# Patient Record
Sex: Female | Born: 1975
Health system: Southern US, Community
[De-identification: ages and names within clinical notes are randomized; demographics above are authoritative.]

## PROBLEM LIST (undated history)

## (undated) DIAGNOSIS — F419 Anxiety disorder, unspecified: Secondary | ICD-10-CM

## (undated) DIAGNOSIS — F32A Depression, unspecified: Secondary | ICD-10-CM

## (undated) DIAGNOSIS — I10 Essential (primary) hypertension: Secondary | ICD-10-CM

## (undated) DIAGNOSIS — F329 Major depressive disorder, single episode, unspecified: Secondary | ICD-10-CM

---

## 2017-07-11 NOTE — L&D Delivery Note (Addendum)
Delivery Note At 7:26 PM a viable and healthy female was delivered via Vaginal, Spontaneous (Presentation: OA ).  APGAR: 9, 9; weight -pending  .   Placenta status:aspontaneous, intact Cord:  with the following complications: Loose nuchal cord, reduced over shoulder. .  Cord pH: N/A  Anesthesia:  Epidural  Episiotomy: None Lacerations: 2nd degree;Perineal Suture Repair: 3.0 vicryl rapide Est. Blood Loss (mL): 250  Mom to ICU for postpartum Magnesium for 24 hours.Pecola Leisure.  Baby to Couplet care / Skin to Skin.  Robley FriesVaishali R Prynce Jacober 01/22/2018, 7:53 PM

## 2017-09-14 DIAGNOSIS — O09522 Supervision of elderly multigravida, second trimester: Secondary | ICD-10-CM | POA: Diagnosis not present

## 2017-09-14 DIAGNOSIS — Z3A20 20 weeks gestation of pregnancy: Secondary | ICD-10-CM | POA: Diagnosis not present

## 2017-09-27 DIAGNOSIS — Z3A22 22 weeks gestation of pregnancy: Secondary | ICD-10-CM | POA: Diagnosis not present

## 2017-09-27 DIAGNOSIS — O09522 Supervision of elderly multigravida, second trimester: Secondary | ICD-10-CM | POA: Diagnosis not present

## 2017-09-27 DIAGNOSIS — Z362 Encounter for other antenatal screening follow-up: Secondary | ICD-10-CM | POA: Diagnosis not present

## 2017-11-09 DIAGNOSIS — O09523 Supervision of elderly multigravida, third trimester: Secondary | ICD-10-CM | POA: Diagnosis not present

## 2017-11-09 DIAGNOSIS — Z3A28 28 weeks gestation of pregnancy: Secondary | ICD-10-CM | POA: Diagnosis not present

## 2017-11-09 DIAGNOSIS — Z3689 Encounter for other specified antenatal screening: Secondary | ICD-10-CM | POA: Diagnosis not present

## 2017-12-03 DIAGNOSIS — Z3A31 31 weeks gestation of pregnancy: Secondary | ICD-10-CM | POA: Diagnosis not present

## 2017-12-03 DIAGNOSIS — O09523 Supervision of elderly multigravida, third trimester: Secondary | ICD-10-CM | POA: Diagnosis not present

## 2017-12-03 DIAGNOSIS — Z3483 Encounter for supervision of other normal pregnancy, third trimester: Secondary | ICD-10-CM | POA: Diagnosis not present

## 2017-12-05 DIAGNOSIS — Z3A31 31 weeks gestation of pregnancy: Secondary | ICD-10-CM | POA: Diagnosis not present

## 2017-12-05 DIAGNOSIS — O09523 Supervision of elderly multigravida, third trimester: Secondary | ICD-10-CM | POA: Diagnosis not present

## 2017-12-07 DIAGNOSIS — O09523 Supervision of elderly multigravida, third trimester: Secondary | ICD-10-CM | POA: Diagnosis not present

## 2017-12-07 DIAGNOSIS — Z3A32 32 weeks gestation of pregnancy: Secondary | ICD-10-CM | POA: Diagnosis not present

## 2017-12-19 DIAGNOSIS — O09523 Supervision of elderly multigravida, third trimester: Secondary | ICD-10-CM | POA: Diagnosis not present

## 2017-12-19 DIAGNOSIS — Z3A33 33 weeks gestation of pregnancy: Secondary | ICD-10-CM | POA: Diagnosis not present

## 2018-01-01 DIAGNOSIS — O09523 Supervision of elderly multigravida, third trimester: Secondary | ICD-10-CM | POA: Diagnosis not present

## 2018-01-01 DIAGNOSIS — Z3689 Encounter for other specified antenatal screening: Secondary | ICD-10-CM | POA: Diagnosis not present

## 2018-01-01 DIAGNOSIS — Z3A36 36 weeks gestation of pregnancy: Secondary | ICD-10-CM | POA: Diagnosis not present

## 2018-01-01 DIAGNOSIS — Z3A35 35 weeks gestation of pregnancy: Secondary | ICD-10-CM | POA: Diagnosis not present

## 2018-01-04 DIAGNOSIS — Z3A36 36 weeks gestation of pregnancy: Secondary | ICD-10-CM | POA: Diagnosis not present

## 2018-01-04 DIAGNOSIS — O09523 Supervision of elderly multigravida, third trimester: Secondary | ICD-10-CM | POA: Diagnosis not present

## 2018-01-05 DIAGNOSIS — Z3A36 36 weeks gestation of pregnancy: Secondary | ICD-10-CM | POA: Diagnosis not present

## 2018-01-05 DIAGNOSIS — O09523 Supervision of elderly multigravida, third trimester: Secondary | ICD-10-CM | POA: Diagnosis not present

## 2018-01-08 DIAGNOSIS — Z3A36 36 weeks gestation of pregnancy: Secondary | ICD-10-CM | POA: Diagnosis not present

## 2018-01-08 DIAGNOSIS — O403XX0 Polyhydramnios, third trimester, not applicable or unspecified: Secondary | ICD-10-CM | POA: Diagnosis not present

## 2018-01-08 DIAGNOSIS — Z0371 Encounter for suspected problem with amniotic cavity and membrane ruled out: Secondary | ICD-10-CM | POA: Diagnosis not present

## 2018-01-08 DIAGNOSIS — O09523 Supervision of elderly multigravida, third trimester: Secondary | ICD-10-CM | POA: Diagnosis not present

## 2018-01-16 ENCOUNTER — Ambulatory Visit (INDEPENDENT_AMBULATORY_CARE_PROVIDER_SITE_OTHER): Payer: Self-pay | Admitting: Pediatrics

## 2018-01-16 DIAGNOSIS — Z7681 Expectant parent(s) prebirth pediatrician visit: Secondary | ICD-10-CM

## 2018-01-17 DIAGNOSIS — R030 Elevated blood-pressure reading, without diagnosis of hypertension: Secondary | ICD-10-CM | POA: Diagnosis not present

## 2018-01-17 DIAGNOSIS — O09513 Supervision of elderly primigravida, third trimester: Secondary | ICD-10-CM | POA: Diagnosis not present

## 2018-01-17 DIAGNOSIS — Z3483 Encounter for supervision of other normal pregnancy, third trimester: Secondary | ICD-10-CM | POA: Diagnosis not present

## 2018-01-17 DIAGNOSIS — Z3A38 38 weeks gestation of pregnancy: Secondary | ICD-10-CM | POA: Diagnosis not present

## 2018-01-19 ENCOUNTER — Other Ambulatory Visit: Payer: Self-pay | Admitting: Obstetrics and Gynecology

## 2018-01-19 DIAGNOSIS — O1404 Mild to moderate pre-eclampsia, complicating childbirth: Secondary | ICD-10-CM | POA: Diagnosis not present

## 2018-01-19 DIAGNOSIS — Z23 Encounter for immunization: Secondary | ICD-10-CM | POA: Diagnosis not present

## 2018-01-19 DIAGNOSIS — Z3A38 38 weeks gestation of pregnancy: Secondary | ICD-10-CM | POA: Diagnosis not present

## 2018-01-19 DIAGNOSIS — O1002 Pre-existing essential hypertension complicating childbirth: Secondary | ICD-10-CM | POA: Diagnosis not present

## 2018-01-19 DIAGNOSIS — O09513 Supervision of elderly primigravida, third trimester: Secondary | ICD-10-CM | POA: Diagnosis not present

## 2018-01-19 DIAGNOSIS — R030 Elevated blood-pressure reading, without diagnosis of hypertension: Secondary | ICD-10-CM | POA: Diagnosis not present

## 2018-01-20 LAB — PROTEIN / CREATININE RATIO, URINE
Creatinine, Urine: 22 mg/dL
Protein Creatinine Ratio: 0.36 mg/mg{Cre} — ABNORMAL HIGH (ref 0.00–0.15)
Total Protein, Urine: 8 mg/dL

## 2018-01-21 ENCOUNTER — Inpatient Hospital Stay (HOSPITAL_COMMUNITY)
Admission: AD | Admit: 2018-01-21 | Discharge: 2018-01-24 | DRG: 807 | Disposition: A | Discharge status: Home / Self Care | Payer: BLUE CROSS/BLUE SHIELD | Source: Ambulatory Visit | Attending: Obstetrics & Gynecology | Admitting: Obstetrics & Gynecology

## 2018-01-21 ENCOUNTER — Encounter (HOSPITAL_COMMUNITY): Payer: Self-pay

## 2018-01-21 DIAGNOSIS — O1493 Unspecified pre-eclampsia, third trimester: Secondary | ICD-10-CM | POA: Diagnosis present

## 2018-01-21 DIAGNOSIS — O1404 Mild to moderate pre-eclampsia, complicating childbirth: Secondary | ICD-10-CM | POA: Diagnosis present

## 2018-01-21 DIAGNOSIS — Z3A38 38 weeks gestation of pregnancy: Secondary | ICD-10-CM

## 2018-01-21 DIAGNOSIS — O1494 Unspecified pre-eclampsia, complicating childbirth: Secondary | ICD-10-CM | POA: Diagnosis present

## 2018-01-21 HISTORY — DX: Major depressive disorder, single episode, unspecified: F32.9

## 2018-01-21 HISTORY — DX: Depression, unspecified: F32.A

## 2018-01-21 HISTORY — DX: Anxiety disorder, unspecified: F41.9

## 2018-01-21 HISTORY — DX: Essential (primary) hypertension: I10

## 2018-01-21 LAB — COMPREHENSIVE METABOLIC PANEL
ALT: 36 U/L (ref 0–44)
AST: 42 U/L — ABNORMAL HIGH (ref 15–41)
Albumin: 3.2 g/dL — ABNORMAL LOW (ref 3.5–5.0)
Alkaline Phosphatase: 112 U/L (ref 38–126)
Anion gap: 12 (ref 5–15)
BUN: 11 mg/dL (ref 6–20)
CO2: 19 mmol/L — ABNORMAL LOW (ref 22–32)
Calcium: 9.3 mg/dL (ref 8.9–10.3)
Chloride: 104 mmol/L (ref 98–111)
Creatinine, Ser: 0.48 mg/dL (ref 0.44–1.00)
GFR calc Af Amer: >60 mL/min (ref >60)
GFR calc non Af Amer: >60 mL/min (ref >60)
Glucose, Bld: 90 mg/dL (ref 70–99)
Potassium: 3.9 mmol/L (ref 3.5–5.1)
Sodium: 135 mmol/L (ref 135–145)
Total Bilirubin: 0.4 mg/dL (ref 0.3–1.2)
Total Protein: 6.6 g/dL (ref 6.5–8.1)

## 2018-01-21 LAB — CBC
HCT: 34.6 % — ABNORMAL LOW (ref 36.0–46.0)
Hemoglobin: 11.9 g/dL — ABNORMAL LOW (ref 12.0–15.0)
MCH: 31.2 pg (ref 26.0–34.0)
MCHC: 34.4 g/dL (ref 30.0–36.0)
MCV: 90.8 fL (ref 78.0–100.0)
Platelets: 235 10*3/uL (ref 150–400)
RBC: 3.81 MIL/uL — ABNORMAL LOW (ref 3.87–5.11)
RDW: 13.3 % (ref 11.5–15.5)
WBC: 16.3 10*3/uL — ABNORMAL HIGH (ref 4.0–10.5)

## 2018-01-21 LAB — LACTATE DEHYDROGENASE: LDH: 198 U/L — ABNORMAL HIGH (ref 98–192)

## 2018-01-21 LAB — PROTEIN, URINE, 24 HOUR
Collection Interval-UPROT: 24 hours
Protein, 24H Urine: 429 mg/d — ABNORMAL HIGH (ref 50–100)
Protein, Urine: 6 mg/dL
Urine Total Volume-UPROT: 7150 mL

## 2018-01-21 MED ORDER — OXYCODONE-ACETAMINOPHEN 5-325 MG PO TABS: 1.0000 | ORAL_TABLET | ORAL | Status: DC | PRN

## 2018-01-21 MED ORDER — TERBUTALINE SULFATE 1 MG/ML IJ SOLN
0.2500 mg | Freq: Once | INTRAMUSCULAR | Status: DC | PRN
  Filled 2018-01-21: qty 1

## 2018-01-21 MED ORDER — OXYTOCIN BOLUS FROM INFUSION
500.0000 mL | Freq: Once | INTRAVENOUS | Status: AC
  Administered 2018-01-22: 500 mL via INTRAVENOUS

## 2018-01-21 MED ORDER — HYDROXYZINE HCL 50 MG PO TABS
50.0000 mg | ORAL_TABLET | Freq: Four times a day (QID) | ORAL | Status: DC | PRN
  Administered 2018-01-21: 50 mg via ORAL
  Filled 2018-01-21 (×3): qty 1

## 2018-01-21 MED ORDER — LACTATED RINGERS IV SOLN: 500.0000 mL | INTRAVENOUS | Status: DC | PRN

## 2018-01-21 MED ORDER — OXYCODONE-ACETAMINOPHEN 5-325 MG PO TABS: 2.0000 | ORAL_TABLET | ORAL | Status: DC | PRN

## 2018-01-21 MED ORDER — FLEET ENEMA 7-19 GM/118ML RE ENEM: 1.0000 | ENEMA | RECTAL | Status: DC | PRN

## 2018-01-21 MED ORDER — MISOPROSTOL 25 MCG QUARTER TABLET
25.0000 ug | ORAL_TABLET | ORAL | Status: DC | PRN
  Administered 2018-01-21 – 2018-01-22 (×3): 25 ug via VAGINAL
  Filled 2018-01-21 (×5): qty 1

## 2018-01-21 MED ORDER — LIDOCAINE HCL (PF) 1 % IJ SOLN
30.0000 mL | INTRAMUSCULAR | Status: DC | PRN
  Filled 2018-01-21: qty 30

## 2018-01-21 MED ORDER — ACETAMINOPHEN 325 MG PO TABS: 650.0000 mg | ORAL_TABLET | ORAL | Status: DC | PRN

## 2018-01-21 MED ORDER — SOD CITRATE-CITRIC ACID 500-334 MG/5ML PO SOLN: 30.0000 mL | ORAL | Status: DC | PRN

## 2018-01-21 MED ORDER — OXYTOCIN 40 UNITS IN LACTATED RINGERS INFUSION - SIMPLE MED
2.5000 [IU]/h | INTRAVENOUS | Status: DC
  Filled 2018-01-21: qty 1000

## 2018-01-21 MED ORDER — FENTANYL CITRATE (PF) 100 MCG/2ML IJ SOLN
50.0000 ug | INTRAMUSCULAR | Status: DC | PRN
  Administered 2018-01-22: 100 ug via INTRAVENOUS
  Filled 2018-01-21: qty 2

## 2018-01-21 MED ORDER — ONDANSETRON HCL 4 MG/2ML IJ SOLN: 4.0000 mg | Freq: Four times a day (QID) | INTRAMUSCULAR | Status: DC | PRN

## 2018-01-21 MED ORDER — LACTATED RINGERS IV SOLN
INTRAVENOUS | Status: DC
  Administered 2018-01-21 – 2018-01-22 (×2): via INTRAVENOUS
  Administered 2018-01-22: 125 mL/h via INTRAVENOUS

## 2018-01-21 NOTE — Progress Notes (Signed)
Prenatal counseling for impending newborn done-- 1st child currently 38wks, previous US did show some oligohydramnios, early prenatal care Z76.81

## 2018-01-21 NOTE — H&P (Signed)
Anna ShoalsJennifer Harrell is a 42 y.o. female presenting for IOL for PEC w/o severe features. OB History   None    No past medical history on file.  Family History: family history is not on file. Social History:  has no tobacco, alcohol, and drug history on file.     Maternal Diabetes: No Genetic Screening: Normal Maternal Ultrasounds/Referrals: Normal Fetal Ultrasounds or other Referrals:  None Maternal Substance Abuse:  No Significant Maternal Medications:  None Significant Maternal Lab Results:  None Other Comments:  None  Review of Systems  Constitutional: Negative.   All other systems reviewed and are negative.  Maternal Medical History:  Contractions: Frequency: rare.   Perceived severity is mild.    Fetal activity: Perceived fetal activity is normal.   Last perceived fetal movement was within the past hour.    Prenatal complications: Pre-eclampsia.   Prenatal Complications - Diabetes: none.      There were no vitals taken for this visit. Maternal Exam:  Uterine Assessment: Contraction strength is mild.  Contraction frequency is rare.   Abdomen: Patient reports no abdominal tenderness. Fetal presentation: vertex  Introitus: Normal vulva. Normal vagina.  Ferning test: not done.  Nitrazine test: not done.  Pelvis: adequate for delivery.   Cervix: Cervix evaluated by digital exam.     Physical Exam  Nursing note and vitals reviewed. Constitutional: She is oriented to person, place, and time. She appears well-developed and well-nourished.  HENT:  Head: Normocephalic and atraumatic.  Neck: Normal range of motion. Neck supple.  Cardiovascular: Normal rate and regular rhythm.  Respiratory: Breath sounds normal.  GI: Soft. Bowel sounds are normal.  Genitourinary: Vagina normal and uterus normal.  Musculoskeletal: Normal range of motion.  Neurological: She is alert and oriented to person, place, and time. She has normal reflexes.  Skin: Skin is warm and dry.   Psychiatric: She has a normal mood and affect.    Prenatal labs: ABO, Rh:   Antibody:   Rubella:   RPR:    HBsAg:    HIV:    GBS:     Assessment/Plan: PEC w/o severe features- newly diagnosed 38 wk IUP  Admit for cervical ripening Ck labs  Dima Mini J 01/21/2018, 10:33 PM

## 2018-01-22 ENCOUNTER — Inpatient Hospital Stay (HOSPITAL_COMMUNITY): Payer: BLUE CROSS/BLUE SHIELD | Admitting: Anesthesiology

## 2018-01-22 ENCOUNTER — Encounter (HOSPITAL_COMMUNITY): Payer: Self-pay

## 2018-01-22 DIAGNOSIS — O1494 Unspecified pre-eclampsia, complicating childbirth: Secondary | ICD-10-CM | POA: Diagnosis present

## 2018-01-22 LAB — CBC WITH DIFFERENTIAL/PLATELET
Basophils Absolute: 0 10*3/uL (ref 0.0–0.1)
Basophils Absolute: 0 10*3/uL (ref 0.0–0.1)
Basophils Relative: 0 %
Basophils Relative: 0 %
Eosinophils Absolute: 0.1 10*3/uL (ref 0.0–0.7)
Eosinophils Absolute: 0.1 10*3/uL (ref 0.0–0.7)
Eosinophils Relative: 1 %
Eosinophils Relative: 0 %
HCT: 37.2 % (ref 36.0–46.0)
HCT: 38.2 % (ref 36.0–46.0)
Hemoglobin: 12.8 g/dL (ref 12.0–15.0)
Hemoglobin: 13 g/dL (ref 12.0–15.0)
Lymphocytes Relative: 11 %
Lymphocytes Relative: 12 %
Lymphs Abs: 1.9 10*3/uL (ref 0.7–4.0)
Lymphs Abs: 2.4 10*3/uL (ref 0.7–4.0)
MCH: 31.6 pg (ref 26.0–34.0)
MCH: 31.5 pg (ref 26.0–34.0)
MCHC: 34.4 g/dL (ref 30.0–36.0)
MCHC: 34 g/dL (ref 30.0–36.0)
MCV: 91.9 fL (ref 78.0–100.0)
MCV: 92.5 fL (ref 78.0–100.0)
Monocytes Absolute: 0.7 10*3/uL (ref 0.1–1.0)
Monocytes Absolute: 0.9 10*3/uL (ref 0.1–1.0)
Monocytes Relative: 4 %
Monocytes Relative: 4 %
Neutro Abs: 14.4 10*3/uL — ABNORMAL HIGH (ref 1.7–7.7)
Neutro Abs: 16.6 10*3/uL — ABNORMAL HIGH (ref 1.7–7.7)
Neutrophils Relative %: 84 %
Neutrophils Relative %: 84 %
Platelets: 223 10*3/uL (ref 150–400)
Platelets: 242 10*3/uL (ref 150–400)
RBC: 4.05 MIL/uL (ref 3.87–5.11)
RBC: 4.13 MIL/uL (ref 3.87–5.11)
RDW: 13.4 % (ref 11.5–15.5)
RDW: 13.5 % (ref 11.5–15.5)
WBC: 17.1 10*3/uL — ABNORMAL HIGH (ref 4.0–10.5)
WBC: 19.9 10*3/uL — ABNORMAL HIGH (ref 4.0–10.5)

## 2018-01-22 LAB — COMPREHENSIVE METABOLIC PANEL
ALT: 37 U/L (ref 0–44)
AST: 42 U/L — ABNORMAL HIGH (ref 15–41)
Albumin: 3.4 g/dL — ABNORMAL LOW (ref 3.5–5.0)
Alkaline Phosphatase: 110 U/L (ref 38–126)
Anion gap: 9 (ref 5–15)
BUN: 7 mg/dL (ref 6–20)
CO2: 23 mmol/L (ref 22–32)
Calcium: 9.1 mg/dL (ref 8.9–10.3)
Chloride: 102 mmol/L (ref 98–111)
Creatinine, Ser: 0.44 mg/dL (ref 0.44–1.00)
GFR calc Af Amer: >60 mL/min (ref >60)
GFR calc non Af Amer: >60 mL/min (ref >60)
Glucose, Bld: 93 mg/dL (ref 70–99)
Potassium: 4 mmol/L (ref 3.5–5.1)
Sodium: 134 mmol/L — ABNORMAL LOW (ref 135–145)
Total Bilirubin: 0.5 mg/dL (ref 0.3–1.2)
Total Protein: 7.2 g/dL (ref 6.5–8.1)

## 2018-01-22 LAB — OB RESULTS CONSOLE HIV ANTIBODY (ROUTINE TESTING): HIV: NONREACTIVE

## 2018-01-22 LAB — TYPE AND SCREEN
ABO/RH(D): O POS
Antibody Screen: NEGATIVE

## 2018-01-22 LAB — ABO/RH: ABO/RH(D): O POS

## 2018-01-22 LAB — OB RESULTS CONSOLE GC/CHLAMYDIA
Chlamydia: NEGATIVE
Gonorrhea: NEGATIVE

## 2018-01-22 LAB — OB RESULTS CONSOLE RPR: RPR: NONREACTIVE

## 2018-01-22 LAB — OB RESULTS CONSOLE HEPATITIS B SURFACE ANTIGEN: Hepatitis B Surface Ag: NEGATIVE

## 2018-01-22 LAB — RPR: RPR Ser Ql: NONREACTIVE

## 2018-01-22 LAB — OB RESULTS CONSOLE RUBELLA ANTIBODY, IGM: Rubella: NON-IMMUNE/NOT IMMUNE

## 2018-01-22 MED ORDER — DIBUCAINE 1 % RE OINT
1.0000 "application " | TOPICAL_OINTMENT | RECTAL | Status: DC | PRN
  Administered 2018-01-22: 1 via RECTAL
  Filled 2018-01-22: qty 28

## 2018-01-22 MED ORDER — LACTATED RINGERS IV SOLN
500.0000 mL | Freq: Once | INTRAVENOUS | Status: AC
  Administered 2018-01-22: 500 mL via INTRAVENOUS

## 2018-01-22 MED ORDER — OXYTOCIN 40 UNITS IN LACTATED RINGERS INFUSION - SIMPLE MED
1.0000 m[IU]/min | INTRAVENOUS | Status: DC
  Administered 2018-01-22: 2 m[IU]/min via INTRAVENOUS
  Administered 2018-01-22: 6 m[IU]/min via INTRAVENOUS
  Administered 2018-01-22: 8 m[IU]/min via INTRAVENOUS

## 2018-01-22 MED ORDER — PRENATAL MULTIVITAMIN CH
1.0000 | ORAL_TABLET | Freq: Every day | ORAL | Status: DC
  Administered 2018-01-23 – 2018-01-24 (×2): 1 via ORAL
  Filled 2018-01-22 (×2): qty 1

## 2018-01-22 MED ORDER — OXYCODONE HCL 5 MG PO TABS: 5.0000 mg | ORAL_TABLET | ORAL | Status: DC | PRN

## 2018-01-22 MED ORDER — ONDANSETRON HCL 4 MG PO TABS: 4.0000 mg | ORAL_TABLET | ORAL | Status: DC | PRN

## 2018-01-22 MED ORDER — SENNOSIDES-DOCUSATE SODIUM 8.6-50 MG PO TABS
2.0000 | ORAL_TABLET | ORAL | Status: DC
  Administered 2018-01-22 – 2018-01-23 (×2): 2 via ORAL
  Filled 2018-01-22 (×2): qty 2

## 2018-01-22 MED ORDER — OXYCODONE HCL 5 MG PO TABS: 10.0000 mg | ORAL_TABLET | ORAL | Status: DC | PRN

## 2018-01-22 MED ORDER — PHENYLEPHRINE 40 MCG/ML (10ML) SYRINGE FOR IV PUSH (FOR BLOOD PRESSURE SUPPORT)
80.0000 ug | PREFILLED_SYRINGE | INTRAVENOUS | Status: DC | PRN
  Filled 2018-01-22: qty 5

## 2018-01-22 MED ORDER — COCONUT OIL OIL: 1.0000 "application " | TOPICAL_OIL | Status: DC | PRN

## 2018-01-22 MED ORDER — WITCH HAZEL-GLYCERIN EX PADS
1.0000 "application " | MEDICATED_PAD | CUTANEOUS | Status: DC | PRN
  Administered 2018-01-22: 1 via TOPICAL

## 2018-01-22 MED ORDER — IBUPROFEN 600 MG PO TABS
600.0000 mg | ORAL_TABLET | Freq: Four times a day (QID) | ORAL | Status: DC
  Administered 2018-01-22 – 2018-01-24 (×7): 600 mg via ORAL
  Filled 2018-01-22 (×7): qty 1

## 2018-01-22 MED ORDER — TETANUS-DIPHTH-ACELL PERTUSSIS 5-2.5-18.5 LF-MCG/0.5 IM SUSP: 0.5000 mL | Freq: Once | INTRAMUSCULAR | Status: DC

## 2018-01-22 MED ORDER — ZOLPIDEM TARTRATE 5 MG PO TABS: 5.0000 mg | ORAL_TABLET | Freq: Every evening | ORAL | Status: DC | PRN

## 2018-01-22 MED ORDER — FENTANYL 2.5 MCG/ML BUPIVACAINE 1/10 % EPIDURAL INFUSION (WH - ANES)
14.0000 mL/h | INTRAMUSCULAR | Status: DC | PRN
  Administered 2018-01-22: 14 mL/h via EPIDURAL
  Filled 2018-01-22: qty 100

## 2018-01-22 MED ORDER — DIPHENHYDRAMINE HCL 50 MG/ML IJ SOLN: 12.5000 mg | INTRAMUSCULAR | Status: DC | PRN

## 2018-01-22 MED ORDER — EPHEDRINE 5 MG/ML INJ
10.0000 mg | INTRAVENOUS | Status: DC | PRN
  Filled 2018-01-22: qty 2

## 2018-01-22 MED ORDER — DIPHENHYDRAMINE HCL 25 MG PO CAPS: 25.0000 mg | ORAL_CAPSULE | Freq: Four times a day (QID) | ORAL | Status: DC | PRN

## 2018-01-22 MED ORDER — BUTORPHANOL TARTRATE 1 MG/ML IJ SOLN: 1.0000 mg | Freq: Once | INTRAMUSCULAR | Status: DC

## 2018-01-22 MED ORDER — PHENYLEPHRINE 40 MCG/ML (10ML) SYRINGE FOR IV PUSH (FOR BLOOD PRESSURE SUPPORT)
80.0000 ug | PREFILLED_SYRINGE | INTRAVENOUS | Status: DC | PRN
  Filled 2018-01-22: qty 10
  Filled 2018-01-22: qty 5

## 2018-01-22 MED ORDER — TERBUTALINE SULFATE 1 MG/ML IJ SOLN: 0.2500 mg | Freq: Once | INTRAMUSCULAR | Status: DC | PRN

## 2018-01-22 MED ORDER — SIMETHICONE 80 MG PO CHEW: 80.0000 mg | CHEWABLE_TABLET | ORAL | Status: DC | PRN

## 2018-01-22 MED ORDER — LIDOCAINE HCL (PF) 1 % IJ SOLN
INTRAMUSCULAR | Status: DC | PRN
  Administered 2018-01-22: 8 mL via EPIDURAL

## 2018-01-22 MED ORDER — MAGNESIUM SULFATE 40 G IN LACTATED RINGERS - SIMPLE
2.0000 g/h | INTRAVENOUS | Status: DC
  Filled 2018-01-22: qty 500

## 2018-01-22 MED ORDER — HYDROXYZINE HCL 50 MG PO TABS
50.0000 mg | ORAL_TABLET | Freq: Four times a day (QID) | ORAL | Status: DC | PRN
  Administered 2018-01-22 – 2018-01-24 (×3): 50 mg via ORAL
  Filled 2018-01-22 (×5): qty 1

## 2018-01-22 MED ORDER — BENZOCAINE-MENTHOL 20-0.5 % EX AERO
1.0000 "application " | INHALATION_SPRAY | CUTANEOUS | Status: DC | PRN
  Administered 2018-01-22: 1 via TOPICAL
  Filled 2018-01-22: qty 56

## 2018-01-22 MED ORDER — ONDANSETRON HCL 4 MG/2ML IJ SOLN: 4.0000 mg | INTRAMUSCULAR | Status: DC | PRN

## 2018-01-22 MED ORDER — ACETAMINOPHEN 325 MG PO TABS: 650.0000 mg | ORAL_TABLET | ORAL | Status: DC | PRN

## 2018-01-22 NOTE — Progress Notes (Signed)
Patient ID: Anna ShoalsJennifer Harrell, female   DOB: 04-16-1976, 42 y.o.   MRN: [id number] Per RN, cervix thick No headaches or visual changes. No epigastric pain. BP 130/81   Pulse 86   Temp 98.2 F (36.8 C) (Oral)   Resp 20   Ht 5\' 6"  (1.676 m)   Wt 75.7 kg (166 lb 12.8 oz)   BMI 26.92 kg/m   CBC    Component Value Date/Time   WBC 17.1 (H) 01/22/2018 0810   RBC 4.05 01/22/2018 0810   HGB 12.8 01/22/2018 0810   HCT 37.2 01/22/2018 0810   PLT 223 01/22/2018 0810   MCV 91.9 01/22/2018 0810   MCH 31.6 01/22/2018 0810   MCHC 34.4 01/22/2018 0810   RDW 13.4 01/22/2018 0810   LYMPHSABS 1.9 01/22/2018 0810   MONOABS 0.7 01/22/2018 0810   EOSABS 0.1 01/22/2018 0810   BASOSABS 0.0 01/22/2018 0810   CMP     Component Value Date/Time   NA 134 (L) 01/22/2018 0810   K 4.0 01/22/2018 0810   CL 102 01/22/2018 0810   CO2 23 01/22/2018 0810   GLUCOSE 93 01/22/2018 0810   BUN 7 01/22/2018 0810   CREATININE 0.44 01/22/2018 0810   CALCIUM 9.1 01/22/2018 0810   PROT 7.2 01/22/2018 0810   ALBUMIN 3.4 (L) 01/22/2018 0810   AST 42 (H) 01/22/2018 0810   ALT 37 01/22/2018 0810   ALKPHOS 110 01/22/2018 0810   BILITOT 0.5 01/22/2018 0810   GFRNONAA >60 01/22/2018 0810   GFRAA >60 01/22/2018 0810   BP and labs stable Will rpt Cytotec at this time.

## 2018-01-22 NOTE — Plan of Care (Signed)
Pt educated on labor/induction process.  All questions answered.  Pt resting comfortably in bed after cytotec x2.  Plan discussed with MD and patient and family.  Will continue to monitor and communicate with team as needed.

## 2018-01-22 NOTE — Progress Notes (Signed)
Anna Harrell is a 42 y.o. G2P0010 at 4864w5d by LMP admitted [facility]  Subjective: NO complaints. NO HAs or visual changes  Objective: BP 123/84   Pulse 78   Temp 98.5 F (36.9 C) (Oral)   Resp 16   Ht 5\' 6"  (1.676 m)   Wt 75.7 kg (166 lb 12.8 oz)   BMI 26.92 kg/m  No intake/output data recorded. Total I/O In: 157 [I.V.:157] Out: -   FHT:  FHR: 125 bpm, variability: moderate,  accelerations:  Present,  decelerations:  Absent UC:   irregular, every 3-7 minutes SVE:   Dilation: 2 Effacement (%): 80 Station: -1 Exam by:: Anna Mustassie Stewart RN   Labs: Lab Results  Component Value Date   WBC 16.3 (H) 01/21/2018   HGB 11.9 (L) 01/21/2018   HCT 34.6 (L) 01/21/2018   MCV 90.8 01/21/2018   PLT 235 01/21/2018    Assessment / Plan: PEC w/o severe features- labs and BP stable  Labor: Progressing normally Preeclampsia:  no signs or symptoms of toxicity, intake and ouput balanced and labs stable Fetal Wellbeing:  Category I Pain Control:  Labor support without medications I/D:  n/a Anticipated MOD:  guarded  Anna Harrell J 01/22/2018, 6:39 AM

## 2018-01-22 NOTE — Anesthesia Pain Management Evaluation Note (Signed)
  CRNA Pain Management Visit Note  Patient: Anna Harrell, 42 y.o., female  "Hello I am a member of the anesthesia team [facility]. We have an anesthesia team available at all times to provide care throughout the hospital, including epidural management and anesthesia for C-section. I don't know your plan for the delivery whether it a natural birth, water birth, IV sedation, nitrous supplementation, doula or epidural, but we want to meet your pain goals."   1.Was your pain managed to your expectations on prior hospitalizations?   No prior hospitalizations  2.What is your expectation for pain management during this hospitalization?     Nitrous Oxide first  Then epidural   3.How can we help you reach that goal? standby  Record the patient's initial score and the patient's pain goal.   Pain: 7  Pain Goal: 3 [facility] wants you to be able to say your pain was always managed very well.  Ulice BoldDavid Adedayo [facility] 01/22/2018

## 2018-01-22 NOTE — Anesthesia Preprocedure Evaluation (Signed)
Anesthesia Evaluation  Patient identified by MRN, date of birth, ID band Patient awake    Reviewed: Allergy & Precautions, H&P , NPO status , Patient's Chart, lab work & pertinent test results, reviewed documented beta blocker date and time   Airway Mallampati: II  TM Distance: >3 FB Neck ROM: full    Dental no notable dental hx.    Pulmonary neg pulmonary ROS, former smoker,    Pulmonary exam normal breath sounds clear to auscultation       Cardiovascular hypertension, negative cardio ROS Normal cardiovascular exam Rhythm:regular Rate:Normal     Neuro/Psych negative neurological ROS  negative psych ROS   GI/Hepatic negative GI ROS, Neg liver ROS,   Endo/Other  negative endocrine ROS  Renal/GU negative Renal ROS  negative genitourinary   Musculoskeletal   Abdominal   Peds  Hematology negative hematology ROS (+)   Anesthesia Other Findings   Reproductive/Obstetrics (+) Pregnancy                             Anesthesia Physical  Anesthesia Plan  ASA: II  Anesthesia Plan: Epidural   Post-op Pain Management:    Induction:   PONV Risk Score and Plan:   Airway Management Planned:   Additional Equipment:   Intra-op Plan:   Post-operative Plan:   Informed Consent: I have reviewed the patients History and Physical, chart, labs and discussed the procedure including the risks, benefits and alternatives for the proposed anesthesia with the patient or authorized representative who has indicated his/her understanding and acceptance.       Plan Discussed with:   Anesthesia Plan Comments:         Anesthesia Quick Evaluation  

## 2018-01-22 NOTE — Anesthesia Procedure Notes (Signed)
Epidural Patient location during procedure: OB Start time: 01/22/2018 5:00 PM End time: 01/22/2018 5:10 PM  Staffing Anesthesiologist: Bethena Midgetddono, Logyn Dedominicis, MD  Preanesthetic Checklist Completed: patient identified, site marked, surgical consent, pre-op evaluation, timeout performed, IV checked, risks and benefits discussed and monitors and equipment checked  Epidural Patient position: sitting Prep: site prepped and draped and DuraPrep Patient monitoring: continuous pulse ox and blood pressure Approach: midline Location: L4-L5 Injection technique: LOR air  Needle:  Needle type: Tuohy  Needle gauge: 17 G Needle length: 9 cm and 9 Needle insertion depth: 8 cm Catheter type: closed end flexible Catheter size: 19 Gauge Catheter at skin depth: 13 cm Test dose: negative  Assessment Events: blood not aspirated, injection not painful, no injection resistance, negative IV test and no paresthesia

## 2018-01-22 NOTE — Progress Notes (Signed)
Anna Harrell is a 42 y.o. G2P0010 at 8870w5d IOL for PEC  Subjective: UCs gettingg stronger   Objective: BP 139/79   Pulse 82   Temp 98.4 F (36.9 C) (Oral)   Resp 16   Ht 5\' 6"  (1.676 m)   Wt 166 lb 12.8 oz (75.7 kg)   BMI 26.92 kg/m  I/O last 3 completed shifts: In: 157 [I.V.:157] Out: -  No intake/output data recorded.  FHT:  FHR: 140 bpm, variability: moderate,  accelerations:  Present,  decelerations:  Absent UC:   regular, every 2-3 minutes SVE: 4/90%/-2/ Vx. AROM clear.   Labs: Lab Results  Component Value Date   WBC 19.9 (H) 01/22/2018   HGB 13.0 01/22/2018   HCT 38.2 01/22/2018   MCV 92.5 01/22/2018   PLT 242 01/22/2018    Assessment / Plan: Induction of labor due to preeclampsia,  progressing well on pitocin  Labor: Progressing normally Preeclampsia:  no signs or symptoms of toxicity Fetal Wellbeing:  Category I Pain Control:  Epidural needed now  I/D:  GBS(-) Anticipated MOD:  NSVD  Robley FriesVaishali R Chari Parmenter 01/22/2018, 6:16 PM

## 2018-01-23 LAB — COMPREHENSIVE METABOLIC PANEL
ALT: 37 U/L (ref 0–44)
AST: 50 U/L — ABNORMAL HIGH (ref 15–41)
Albumin: 2.7 g/dL — ABNORMAL LOW (ref 3.5–5.0)
Alkaline Phosphatase: 89 U/L (ref 38–126)
Anion gap: 9 (ref 5–15)
BUN: 9 mg/dL (ref 6–20)
CO2: 20 mmol/L — ABNORMAL LOW (ref 22–32)
Calcium: 9 mg/dL (ref 8.9–10.3)
Chloride: 103 mmol/L (ref 98–111)
Creatinine, Ser: 0.56 mg/dL (ref 0.44–1.00)
GFR calc Af Amer: >60 mL/min (ref >60)
GFR calc non Af Amer: >60 mL/min (ref >60)
Glucose, Bld: 86 mg/dL (ref 70–99)
Potassium: 3.8 mmol/L (ref 3.5–5.1)
Sodium: 132 mmol/L — ABNORMAL LOW (ref 135–145)
Total Bilirubin: 0.8 mg/dL (ref 0.3–1.2)
Total Protein: 5.9 g/dL — ABNORMAL LOW (ref 6.5–8.1)

## 2018-01-23 LAB — CBC
HCT: 33.1 % — ABNORMAL LOW (ref 36.0–46.0)
Hemoglobin: 11.4 g/dL — ABNORMAL LOW (ref 12.0–15.0)
MCH: 31.1 pg (ref 26.0–34.0)
MCHC: 34.4 g/dL (ref 30.0–36.0)
MCV: 90.4 fL (ref 78.0–100.0)
Platelets: 201 10*3/uL (ref 150–400)
RBC: 3.66 MIL/uL — ABNORMAL LOW (ref 3.87–5.11)
RDW: 13.5 % (ref 11.5–15.5)
WBC: 19.5 10*3/uL — ABNORMAL HIGH (ref 4.0–10.5)

## 2018-01-23 LAB — MAGNESIUM: Magnesium: 1.5 mg/dL — ABNORMAL LOW (ref 1.7–2.4)

## 2018-01-23 MED ORDER — HYDROCORTISONE 2.5 % RE CREA
TOPICAL_CREAM | Freq: Four times a day (QID) | RECTAL | Status: DC
  Administered 2018-01-23 – 2018-01-24 (×3): via RECTAL
  Filled 2018-01-23: qty 28.35

## 2018-01-23 MED ORDER — HYDROCORTISONE ACE-PRAMOXINE 1-1 % RE FOAM
1.0000 | Freq: Four times a day (QID) | RECTAL | Status: DC
  Administered 2018-01-23 – 2018-01-24 (×2): 1 via RECTAL
  Filled 2018-01-23 (×2): qty 10

## 2018-01-23 MED ORDER — MEASLES, MUMPS & RUBELLA VAC ~~LOC~~ INJ
0.5000 mL | INJECTION | Freq: Once | SUBCUTANEOUS | Status: AC
  Administered 2018-01-24: 0.5 mL via SUBCUTANEOUS
  Filled 2018-01-23: qty 0.5

## 2018-01-23 NOTE — Progress Notes (Signed)
PPD 1 SVD with 2nd degree repair / PEC  S:  Reports feeling ok - hemorrhoid pain intense (hx outside of pregnancy)              Denies PIH labs             Tolerating po/ No nausea or vomiting             Bleeding is light             Pain controlled with Motrin             Up ad lib / ambulatory / voiding QS  Newborn Breast / female  O:   VS: BP (!) 125/91 (BP Location: Left Arm)   Pulse 70   Temp 97.7 F (36.5 C) (Oral)   Resp 16   Ht '5\' 6"'$  (1.676 m)   Wt 75.7 kg (166 lb 12.8 oz)   SpO2 100%   Breastfeeding? Unknown   BMI 26.92 kg/m    BP: 125/91 - 122/85 - 129/91 - 126/91 / 141/97  LABS:             Recent Labs    01/22/18 1621 01/23/18 0636  WBC 19.9* 19.5*  HGB 13.0 11.4*  PLT 242 201   Results for Anna, Harrell (MRN [id number]) as of 01/23/2018 08:31  Ref. Range 01/23/2018 06:36  Potassium Latest Ref Range: 3.5 - 5.1 mmol/L 3.8  Chloride Latest Ref Range: 98 - 111 mmol/L 103  CO2 Latest Ref Range: 22 - 32 mmol/L 20 (L)  Glucose Latest Ref Range: 70 - 99 mg/dL 86  BUN Latest Ref Range: 6 - 20 mg/dL 9  Creatinine Latest Ref Range: 0.44 - 1.00 mg/dL 0.56  Magnesium Latest Ref Range: 1.7 - 2.4 mg/dL 1.5 (L)  Alkaline Phosphatase Latest Ref Range: 38 - 126 U/L 89  Albumin Latest Ref Range: 3.5 - 5.0 g/dL 2.7 (L)  AST Latest Ref Range: 15 - 41 U/L 50 (H)  ALT Latest Ref Range: 0 - 44 U/L 37               Blood type: O pos  Rubella: Nonimmune (07/15 0000)  - MMR ordered               I&O: not measured              Physical Exam:             Alert and oriented X3  Lungs: Clear and unlabored  Heart: regular rate and rhythm / no mumurs  Abdomen: soft, non-tender, non-distended              Fundus: firm, non-tender, Ueven  Perineum: ice pack in place / + clustered inflamed hemorrhoids  Lochia: moderate  Extremities: trace edema, no calf pain or tenderness    A: PPD # 1 SVD with 2nd degree repair             preeclampsia - stable labs and BP  chronic hemorrhoids with current inflammation  P: Routine post partum orders             Monitor labs and BP closely - will need BP recheck Friday and next week for close follow-up    Artelia Laroche CNM, MSN, [facility] 01/23/2018, 8:30 AM

## 2018-01-23 NOTE — Anesthesia Postprocedure Evaluation (Signed)
Anesthesia Post Note  Patient: Peggyann ShoalsJennifer Romack  Procedure(s) Performed: AN AD HOC LABOR EPIDURAL     Patient location during evaluation: Mother Baby Anesthesia Type: Epidural Level of consciousness: awake and alert and oriented Pain management: satisfactory to patient Vital Signs Assessment: post-procedure vital signs reviewed and stable Respiratory status: spontaneous breathing and nonlabored ventilation Cardiovascular status: stable Postop Assessment: no headache, no backache, no signs of nausea or vomiting, adequate PO intake, patient able to bend at knees and able to ambulate (patient up walking) Anesthetic complications: no    Last Vitals:  Vitals:   01/23/18 0257 01/23/18 0543  BP: 122/85 (!) 125/91  Pulse: 75 70  Resp: 16 16  Temp: 36.8 C 36.5 C  SpO2: 99% 100%    Last Pain:  Vitals:   01/23/18 0543  TempSrc: Oral  PainSc:    Pain Goal:                 Madison HickmanGREGORY,Alina Gilkey

## 2018-01-23 NOTE — Lactation Note (Signed)
This note was copied from a baby's chart. Lactation Consultation Note  Patient Name: Anna Peggyann ShoalsJennifer Ran ZOXWR'UToday's Date: 01/23/2018   [facility] Assistance:  Infant sleeping as I arrived.  Mother stated that baby wants to only latch on the nipple and "pushes" away from the breast until she gets the nipple.  Since baby is not cueing I suggested mother call me back for the next feeding and I will be happy to assist.  Mother will call when ready to feed.       Taelar Gronewold R Taryll Reichenberger 01/23/2018, 8:54 PM

## 2018-01-23 NOTE — Progress Notes (Signed)
[facility] and verified GBS was negative on 01/01/18. Pediatrician Notified.

## 2018-01-23 NOTE — Lactation Note (Signed)
This note was copied from a baby's chart. Lactation Consultation Note  Patient Name: Anna Harrell ZOXWR'UToday's Date: 01/23/2018 Reason for consult: Follow-up assessment;Difficult latch  Follow Up for Latch Assistance:  Infant awake and alert and ready to feed. Assisted mother to latch in the football hold on the left breast.  With repeated attempts baby was able to latch but was fussy at breast.  She kept wanting to pull of the breast.  Burpted and attempted again with the same results.   Tried the cross cradle position but baby still would not suck.  She was fussy at breast and repeated attempts needed.  I suggested and showed mother how to do hand expression and she obtained drops of colostrum from both breasts which I finger fed back to baby.  She calmed down and sucked very well on my gloved finger.  Tried to latch in the football hold on the right breast and was more successful.  This time baby latched and did some sucking.  A few audible swallows were noted and she began to get sleepy.  Mother satisfied with how to latch and will keep practicing.  Baby was STS with mother at end of feeding and content.  Encouraged continued hand expression and to finger feed any EBM mother may obtain.  She will call as needed for assistance.  RN updated.   Maternal Data Formula Feeding for Exclusion: No Has patient been taught Hand Expression?: Yes  Feeding Feeding Type: Breast Fed Length of feed: 20 min(on and off)  LATCH Score Latch: Repeated attempts needed to sustain latch, nipple held in mouth throughout feeding, stimulation needed to elicit sucking reflex.  Audible Swallowing: A few with stimulation  Type of Nipple: Everted at rest and after stimulation  Comfort (Breast/Nipple): Soft / non-tender  Hold (Positioning): Assistance needed to correctly position infant at breast and maintain latch.  LATCH Score: 7  Interventions Interventions: Breast feeding basics reviewed;Assisted  with latch;Skin to skin;Breast massage;Hand express;Position options;Support pillows;Adjust position;Breast compression  Lactation Tools Discussed/Used WIC Program: No   Consult Status Consult Status: Follow-up Date: 01/24/18 Follow-up type: In-patient    Ina Poupard R Lolitha Tortora 01/23/2018, 10:20 PM

## 2018-01-23 NOTE — Lactation Note (Signed)
This note was copied from a baby's chart. Lactation Consultation Note Baby 10 hrs old. Mom has been exhausted, slept a lot during the night.  Mom encouraged to waken baby for feeds if hasn't cued in 3 hrs. Mom encouraged to feed baby 8-12 times/24 hours and with feeding cues. Newborn behavior, STS, I&O, feeding habits, supply and demand discussed. Mom encouraged to feed baby 8-12 times/24 hours and with feeding cues.  Mom has tubular soft breast, everted compressible nipples. Hand expression taught collecting 3 ml colostrum.  Baby latched in football position using "C" hold. Mom used scissor hold at times. Mom taught breast compression and massage during feeding at intervals. Baby spitting. Mom didn't know what the baby was doing, thought baby was cueing.  Answered questions. Encouraged to call for assistance or concerns. WH/LC brochure given w/resources, support groups and LC services.  Patient Name: Anna Peggyann ShoalsJennifer Mcalpine Today's Date: 01/23/2018 Reason for consult: Initial assessment;1st time breastfeeding   Maternal Data Has patient been taught Hand Expression?: Yes Does the patient have breastfeeding experience prior to this delivery?: No  Feeding Feeding Type: Breast Fed Length of feed: 13 min  LATCH Score Latch: Repeated attempts needed to sustain latch, nipple held in mouth throughout feeding, stimulation needed to elicit sucking reflex.  Audible Swallowing: A few with stimulation  Type of Nipple: Everted at rest and after stimulation  Comfort (Breast/Nipple): Soft / non-tender  Hold (Positioning): Assistance needed to correctly position infant at breast and maintain latch.  LATCH Score: 7  Interventions Interventions: Breast feeding basics reviewed;Support pillows;Assisted with latch;Position options;Skin to skin;Breast massage;Expressed milk;Hand express;Breast compression;Adjust position  Lactation Tools Discussed/Used WIC Program: No   Consult Status Consult  Status: Follow-up Date: 01/24/18 Follow-up type: In-patient    Charyl DancerCARVER, Jacory Kamel G 01/23/2018, 6:21 AM

## 2018-01-23 NOTE — Progress Notes (Signed)
Patient ID: Anna ShoalsJennifer Tolle, female   DOB: 02/22/1976, 42 y.o.   MRN: [id number] Late entry- Pt requests to avoid Magnesium since BPs are stable and no HA/ vision changes/ RUQ pain. She was induced for mild preeclampsia and now delivered with improved BPs.  Will continue to monitor BP and symptoms, start magnesium if neurologic symptoms or BP in severe range.   V.Demiana Crumbley, MD

## 2018-01-23 NOTE — Progress Notes (Signed)
[name] was referred for history of depression/anxiety. * Referral screened out by Clinical Social Worker because none of the following criteria appear to apply: ~ History of anxiety/depression during this pregnancy, or of post-partum depression. ~ Diagnosis of anxiety and/or depression within last 3 years OR * [name]'s symptoms currently being treated with medication and/or therapy; [name] currently taking Zoloft.   Please contact the Clinical Social Worker if needs arise, by [name] request, or if [name] scores greater than 9/yes to question 10 on Edinburgh Postpartum Depression Screen.  Anna Harrell, MSW, LCSW Clinical Social Work [phone]    

## 2018-01-24 MED ORDER — HYDROCORTISONE 2.5 % RE CREA: TOPICAL_CREAM | Freq: Four times a day (QID) | RECTAL | 0 refills | Status: DC

## 2018-01-24 MED ORDER — ACETAMINOPHEN 325 MG PO TABS: 650.0000 mg | ORAL_TABLET | ORAL | 1 refills | Status: DC | PRN

## 2018-01-24 MED ORDER — WITCH HAZEL-GLYCERIN EX PADS: 1.0000 "application " | MEDICATED_PAD | CUTANEOUS | 12 refills | Status: DC | PRN

## 2018-01-24 MED ORDER — IBUPROFEN 600 MG PO TABS: 600.0000 mg | ORAL_TABLET | Freq: Four times a day (QID) | ORAL | 0 refills | Status: DC

## 2018-01-24 MED ORDER — DIBUCAINE 1 % RE OINT: 1.0000 "application " | TOPICAL_OINTMENT | RECTAL | 0 refills | Status: DC | PRN

## 2018-01-24 NOTE — Lactation Note (Addendum)
This note was copied from a baby's chart. Lactation Consultation Note:  Mother reports that nipples are very sore and tender. She reports that infant doesn't open her mouth very wide. Mother reports that she is having difficulty expressing colostrum and her nipple hurts when she expresses.  Assist with proper hand expression. Mother observed large drops and denies discomfort. Mother very excited to see volume from breast.   Assist mother to chair for proper positioning and support.  Mother taught cross cradle hold and off sided latch technique.  Infant latched on with good depth. Infant sustained latch for 30 mins.  Discussed observing for suckling and swallows.   Discussed treatment and prevention of engorgement.   Mother was given a harmony hand pump with instructions. She was also give comfort gels.  Advised mother to continue to breastfeed infant 8-12 times in 24 hours.  Discussed cluster feeding and cue base feeding. Advised parents to keep accurate account of all wet and dirty diapers.  Discussed signs of a contented baby. Encouraged to continue to do STS.  Mother is aware of [facility] and outpatient dept services.         Patient Name: Anna Peggyann ShoalsJennifer Cabal BJYNW'GToday's Date: 01/24/2018 Reason for consult: Follow-up assessment   Maternal Data    Feeding Feeding Type: Breast Fed Length of feed: 20 min  LATCH Score Latch: Grasps breast easily, tongue down, lips flanged, rhythmical sucking.  Audible Swallowing: Spontaneous and intermittent  Type of Nipple: Everted at rest and after stimulation  Comfort (Breast/Nipple): Filling, red/small blisters or bruises, mild/mod discomfort(nipples very pink and tender)  Hold (Positioning): Assistance needed to correctly position infant at breast and maintain latch.  LATCH Score: 8  Interventions Interventions: Assisted with latch;Skin to skin;Hand express;Breast compression;Adjust position;Support  pillows;Position options;Expressed milk;Comfort gels;Hand pump  Lactation Tools Discussed/Used     Consult Status Consult Status: Follow-up Date: 01/24/18 Follow-up type: In-patient    Stevan BornKendrick, Dayana Dalporto [facility] 01/24/2018, 9:36 AM

## 2018-01-24 NOTE — Discharge Instructions (Signed)
Breastfeeding Challenges and Solutions Even though breastfeeding is natural, it can be challenging, especially in the first few weeks after childbirth. It is normal for problems to arise when starting to breastfeed your new baby, even if you have breastfed before. This document provides some solutions to the most common breastfeeding challenges. Challenges and solutions Challenge--Cracked or Sore Nipples Cracked or sore nipples are commonly experienced by breastfeeding mothers. Cracked or sore nipples often are caused by inadequate latching (when your baby's mouth attaches to your breast to breastfeed). Soreness can also happen if your baby is not positioned properly at your breast. Although nipple cracking and soreness are common during the first week after birth, nipple pain is never normal. If you experience nipple cracking or soreness that lasts longer than 1 week or nipple pain, call your health care provider or lactation consultant. Solution Ensure proper latching and positioning of your baby by following the steps below:  Find a comfortable place to sit or lie down, with your neck and back well supported.  Place a pillow or rolled up blanket under your baby to bring him or her to the level of your breast (if you are seated).  Make sure that your baby's abdomen is facing your abdomen.  Gently massage your breast. With your fingertips, massage from your chest wall toward your nipple in a circular motion. This encourages milk flow. You may need to continue this action during the feeding if your milk flows slowly.  Support your breast with 4 fingers underneath and your thumb above your nipple. Make sure your fingers are well away from your nipple and your babys mouth.  Stroke your baby's lips gently with your finger or nipple.  When your baby's mouth is open wide enough, quickly bring your baby to your breast, placing your entire nipple and as much of the colored area around your nipple  (areola) as possible into your baby's mouth. ? More areola should be visible above your baby's upper lip than below the lower lip. ? Your baby's tongue should be between his or her lower gum and your breast.  Ensure that your baby's mouth is correctly positioned around your nipple (latched). Your baby's lips should create a seal on your breast and be turned out (everted).  It is common for your baby to suck for about 2-3 minutes in order to start the flow of breast milk.  Signs that your baby has successfully latched on to your nipple include:  Quietly tugging or quietly sucking without causing you pain.  Swallowing heard between every 3-4 sucks.  Muscle movement above and in front of his or her ears with sucking.  Signs that your baby has not successfully latched on to nipple include:  Sucking sounds or smacking sounds from your baby while nursing.  Nipple pain.  Ensure that your breasts stay moisturized and healthy by:  Avoiding the use of soap on your nipples.  Wearing a supportive bra. Avoid wearing underwire-style bras or tight bras.  Air drying your nipples for 3-4 minutes after each feeding.  Using only cotton bra pads to absorb breast milk leakage. Leaking of breast milk between feedings is normal. Be sure to change the pads if they become soaked with milk.  Using lanolin on your nipples after nursing. Lanolin helps to maintain your skin's normal moisture barrier. If you use pure lanolin you do not need to wash it off before feeding your baby again. Pure lanolin is not toxic to your baby. You may also  hand express a few drops of breast milk and gently massage that milk into your nipples, allowing it to air dry.  Challenge--Breast Engorgement Breast engorgement is the overfilling of your breasts with breast milk. In the first few weeks after giving birth, you may experience breast engorgement. Breast engorgement can make your breasts throb and feel hard, tightly stretched,  warm, and tender. Engorgement peaks about the fifth day after you give birth. Having breast engorgement does not mean you have to stop breastfeeding your baby. Solution  Breastfeed when you feel the need to reduce the fullness of your breasts or when your baby shows signs of hunger. This is called "breastfeeding on demand."  Newborns (babies younger than 4 weeks) often breastfeed every 1-3 hours during the day. You may need to awaken your baby to feed if he or she is asleep at a feeding time.  Do not allow your baby to sleep longer than 5 hours during the night without a feeding.  Pump or hand express breast milk before breastfeeding to soften your breast, areola, and nipple.  Apply warm, moist heat (in the shower or with warm water-soaked hand towels) just before feeding or pumping, or massage your breast before or during breastfeeding. This increases circulation and helps your milk to flow.  Completely empty your breasts when breastfeeding or pumping. Afterward, wear a snug bra (nursing or regular) or tank top for 1-2 days to signal your body to slightly decrease milk production. Only wear snug bras or tank tops to treat engorgement. Tight bras typically should be avoided by breastfeeding mothers. Once engorgement is relieved, return to wearing regular, loose-fitting clothes.  Apply ice packs to your breasts to lessen the pain from engorgement and relieve swelling, unless the ice is uncomfortable for you.  Do not delay feedings. Try to relax when it is time to feed your baby. This helps to trigger your "let-down reflex," which releases milk from your breast.  Ensure your baby is latched on to your breast and positioned properly while breastfeeding.  Allow your baby to remain at your breast as long as he or she is latched on well and actively sucking. Your baby will let you know when he or she is done breastfeeding by pulling away from your breast or falling asleep.  Avoid introducing bottles  or pacifiers to your baby in the early weeks of breastfeeding. Wait to introduce these things until after resolving any breastfeeding challenges.  Try to pump your milk on the same schedule as when your baby would breastfeed if you are returning to work or away from home for an extended period.  Drink plenty of fluids to avoid dehydration, which can eventually put you at greater risk of breast engorgement.  If you follow these suggestions, your engorgement should improve in 24-48 hours. If you are still experiencing difficulty, call your lactation consultant or health care provider. Challenge--Plugged Milk Ducts Plugged milk ducts occur when the duct does not drain milk effectively and becomes swollen. Wearing a tight-fitting nursing bra or having difficulty with latching may cause plugged milk ducts. Not drinking enough water (8-10 c [1.9-2.4 L] per day) can contribute to plugged milk ducts. Once a duct has become plugged, hard lumps, soreness, and redness may develop in your breast. Solution Do not delay feedings. Feed your baby frequently and try to empty your breasts of milk at each feeding. Try breastfeeding from the affected side first so there is a better chance that the milk will drain completely from  that breast. Apply warm, moist towels to your breasts for 5-10 minutes before feeding. Alternatively, a hot shower right before breastfeeding can provide the moist heat that can encourage milk flow. Gentle massage of the sore area before and during a feeding may also help. Avoid wearing tight clothing or bras that put pressure on your breasts. Wear bras that offer good support to your breasts, but avoid underwire bras. If you have a plugged milk duct and develop a fever, you need to see your health care provider. Challenge--Mastitis Mastitis is inflammation of your breast. It usually is caused by a bacterial infection and can cause flu-like symptoms. You may develop redness in your breast and a  fever. Often when mastitis occurs, your breast becomes firm, warm, and very painful. The most common causes of mastitis are poor latching, ineffective sucking from your baby, consistent pressure on your breast (possibly from wearing a tight-fitting bra or shirt that restricts the milk flow), unusual stress or fatigue, or missed feedings. Solution You will be given antibiotic medicine to treat the infection. It is still important to breastfeed frequently to empty your breasts. Continuing to breastfeed while you recover from mastitis will not harm your baby. Make sure your baby is positioned properly during every feeding. Apply moist heat to your breasts for a few minutes before feeding to help the milk flow and to help your breasts empty more easily. Challenge--Thrush Ritta Slot is a yeast infection that can form on your nipples, in your breast, or in your baby's mouth. It causes itching, soreness, burning or stabbing pain, and sometimes a rash. Solution You will be given a medicated ointment for your nipples, and your baby will be given a liquid medicine for his or her mouth. It is important that you and your baby are treated at the same time because thrush can be passed between you and your baby. Change disposable nursing pads often. Any bras, towels, or clothing that come in contact with infected areas of your body or your baby's body need to be washed in very hot water every day. Wash your hands and your baby's hands often. All pacifiers, bottle nipples, or toys your baby puts in his or her mouth should be boiled once a day for 20 minutes. After 1 week of treatment, discard pacifiers and bottle nipples and buy new ones. All breast pump parts that touch the milk need to be boiled for 20 minutes every day. Challenge--Low Milk Supply You may not be producing enough milk if your baby is not gaining the proper amount of weight. Breast milk production is based on a supply-and-demand system. Your milk supply depends  on how frequently and effectively your baby empties your breast. Solution The more you breastfeed and pump, the more breast milk you will produce. It is important that your baby empties at least one of your breasts at each feeding. If this is not happening, then use a breast pump or hand express any milk that remains. This will help to drain as much milk as possible at each feeding. It will also signal your body to produce more milk. If your baby is not emptying your breasts, it may be due to latching, sucking, or positioning problems. If low milk supply continues after addressing these issues, contact your health care provider or a lactation specialist as soon as possible. Challenge--Inverted or Flat Nipples Some women have nipples that turn inward instead of protruding outward. Other women have nipples that are flat. Inverted or flat nipples can  sometimes make it more difficult for your baby to latch onto your breast. Solution You may be given a small device that pulls out inverted nipples. This device should be applied right before your baby is brought to your breast. You can also try using a breast pump for a short time before placing the baby at your breast. The pump can pull your nipple outwards to help your infant latch more easily. The baby's sucking motion will help the inverted nipple protrude as well. If you have flat nipples, encourage your baby to latch onto your breast and feed frequently in the early days after birth. This will give your baby practice latching on correctly while your breast is still soft. When your milk supply increases, between the second and fifth day after birth and your breasts become full, your baby will have an easier time latching. Contact a lactation consultant if you still have concerns. She or he can teach you additional techniques to address breastfeeding problems related to nipple shape and position. Where to find more information: [employer]:  www.llli.org This information is not intended to replace advice given to you by your health care provider. Make sure you discuss any questions you have with your health care provider. Document Released: 12/19/2005 Document Revised: 12/09/2015 Document Reviewed: 12/21/2012 Elsevier Interactive Patient Education  2017 [employer] Inc[employer]   Postpartum Hypertension Postpartum hypertension is high blood pressure after pregnancy that remains higher than normal for more than two days after delivery. You may not realize that you have postpartum hypertension if your blood pressure is not being checked regularly. In some cases, postpartum hypertension will go away on its own, usually within a week of delivery. However, for some women, medical treatment is required to prevent serious complications, such as seizures or stroke. The following things can affect your blood pressure:  The type of delivery you had.  Having received IV fluids or other medicines during or after delivery.  What are the causes? Postpartum hypertension may be caused by any of the following or by a combination of any of the following:  Hypertension that existed before pregnancy (chronic hypertension).  Gestational hypertension.  Preeclampsia or eclampsia.  Receiving a lot of fluid through an IV during or after delivery.  Medicines.  HELLP syndrome.  Hyperthyroidism.  Stroke.  Other rare neurological or blood disorders.  In some cases, the cause may not be known. What increases the risk? Postpartum hypertension can be related to one or more risk factors, such as:  Chronic hypertension. In some cases, this may not have been diagnosed before pregnancy.  Obesity.  Type 2 diabetes.  Kidney disease.  Family history of preeclampsia.  Other medical conditions that cause hormonal imbalances.  What are the signs or symptoms? As with all types of hypertension, postpartum hypertension may not have any symptoms. Depending  on how high your blood pressure is, you may experience:  Headaches. These may be mild, moderate, or severe. They may also be steady, constant, or sudden in onset (thunderclap headache).  Visual changes.  Dizziness.  Shortness of breath.  Swelling of your hands, feet, lower legs, or face. In some cases, you may have swelling in more than one of these locations.  Heart palpitations or a racing heartbeat.  Difficulty breathing while lying down.  Decreased urination.  Other rare signs and symptoms may include:  Sweating more than usual. This lasts longer than a few days after delivery.  Chest pain.  Sudden dizziness when you get up  from sitting or lying down.  Seizures.  Nausea or vomiting.  Abdominal pain.  How is this diagnosed? The diagnosis of postpartum hypertension is made through a combination of physical examination findings and testing of your blood and urine. You may also have additional tests, such as a CT scan or an MRI, to check for other complications of postpartum hypertension. How is this treated? When blood pressure is high enough to require treatment, your options may include:  Medicines to reduce blood pressure (antihypertensives). Tell your health care provider if you are breastfeeding or if you plan to breastfeed. There are many antihypertensive medicines that are safe to take while breastfeeding.  Stopping medicines that may be causing hypertension.  Treating medical conditions that are causing hypertension.  Treating the complications of hypertension, such as seizures, stroke, or kidney problems.  Your health care provider will also continue to monitor your blood pressure closely and repeatedly until it is within a safe range for you. Follow these instructions at home:  Take medicines only as directed by your health care provider.  Get regular exercise after your health care provider tells you that it is safe.  Follow your health care providers  recommendations on fluid and salt restrictions.  Do not use any tobacco products, including cigarettes, chewing tobacco, or electronic cigarettes. If you need help quitting, ask your health care provider.  Keep all follow-up visits as directed by your health care provider. This is important. Contact a health care provider if:  Your symptoms get worse.  You have new symptoms, such as: ? Headache. ? Dizziness. ? Visual changes. Get help right away if:  You develop a severe or sudden headache.  You have seizures.  You develop numbness or weakness on one side of your body.  You have difficulty thinking, speaking, or swallowing.  You develop severe abdominal pain.  You develop difficulty breathing, chest pain, a racing heartbeat, or heart palpitations. These symptoms may represent a serious problem that is an emergency. Do not wait to see if the symptoms will go away. Get medical help right away. Call your local emergency services (911 in the [place] Do not drive yourself to the hospital. This information is not intended to replace advice given to you by your health care provider. Make sure you discuss any questions you have with your health care provider. Document Released: 02/28/2014 Document Revised: 11/30/2015 Document Reviewed: 01/09/2014 Elsevier Interactive Patient Education  2018 [employer] Postpartum Care After Vaginal Delivery The period of time right after you deliver your newborn is called the postpartum period. What kind of medical care will I receive?  You may continue to receive fluids and medicines through an IV tube inserted into one of your veins.  If an incision was made near your vagina (episiotomy) or if you had some vaginal tearing during delivery, cold compresses may be placed on your episiotomy or your tear. This helps to reduce pain and swelling.  You may be given a squirt bottle to use when you go to the bathroom. You may use this until you are  comfortable wiping as usual. To use the squirt bottle, follow these steps: ? Before you urinate, fill the squirt bottle with warm water. Do not use hot water. ? After you urinate, while you are sitting on the toilet, use the squirt bottle to rinse the area around your urethra and vaginal opening. This rinses away any urine and blood. ? You may do this instead of wiping. As you start healing,  you may use the squirt bottle before wiping yourself. Make sure to wipe gently. ? Fill the squirt bottle with clean water every time you use the bathroom.  You will be given sanitary pads to wear. How can I expect to feel?  You may not feel the need to urinate for several hours after delivery.  You will have some soreness and pain in your abdomen and vagina.  If you are breastfeeding, you may have uterine contractions every time you breastfeed for up to several weeks postpartum. Uterine contractions help your uterus return to its normal size.  It is normal to have vaginal bleeding (lochia) after delivery. The amount and appearance of lochia is often similar to a menstrual period in the first week after delivery. It will gradually decrease over the next few weeks to a dry, yellow-brown discharge. For most women, lochia stops completely by 6-8 weeks after delivery. Vaginal bleeding can vary from woman to woman.  Within the first few days after delivery, you may have breast engorgement. This is when your breasts feel heavy, full, and uncomfortable. Your breasts may also throb and feel hard, tightly stretched, warm, and tender. After this occurs, you may have milk leaking from your breasts.Your health care provider can help you relieve discomfort due to breast engorgement. Breast engorgement should go away within a few days.  You may feel more sad or worried than normal due to hormonal changes after delivery. These feelings should not last more than a few days. If these feelings do not go away after several days,  speak with your health care provider. How should I care for myself?  Tell your health care provider if you have pain or discomfort.  Drink enough water to keep your urine clear or pale yellow.  Wash your hands thoroughly with soap and water for at least 20 seconds after changing your sanitary pads, after using the toilet, and before holding or feeding your baby.  If you are not breastfeeding, avoid touching your breasts a lot. Doing this can make your breasts produce more milk.  If you become weak or lightheaded, or you feel like you might faint, ask for help before: ? Getting out of bed. ? Showering.  Change your sanitary pads frequently. Watch for any changes in your flow, such as a sudden increase in volume, a change in color, the passing of large blood clots. If you pass a blood clot from your vagina, save it to show to your health care provider. Do not flush blood clots down the toilet without having your health care provider look at them.  Make sure that all your vaccinations are up to date. This can help protect you and your baby from getting certain diseases. You may need to have immunizations done before you leave the hospital.  If desired, talk with your health care provider about methods of family planning or birth control (contraception). How can I start bonding with my baby? Spending as much time as possible with your baby is very important. During this time, you and your baby can get to know each other and develop a bond. Having your baby stay with you in your room (rooming in) can give you time to get to know your baby. Rooming in can also help you become comfortable caring for your baby. Breastfeeding can also help you bond with your baby. How can I plan for returning home with my baby?  Make sure that you have a car seat installed in your vehicle. ?  Your car seat should be checked by a certified car seat installer to make sure that it is installed safely. ? Make sure that  your baby fits into the car seat safely.  Ask your health care provider any questions you have about caring for yourself or your baby. Make sure that you are able to contact your health care provider with any questions after leaving the hospital. This information is not intended to replace advice given to you by your health care provider. Make sure you discuss any questions you have with your health care provider. Document Released: 04/24/2007 Document Revised: 11/30/2015 Document Reviewed: 06/01/2015 Elsevier Interactive Patient Education  2018 [employer]   Postpartum Depression and Baby Blues The postpartum period begins right after the birth of a baby. During this time, there is often a great amount of joy and excitement. It is also a time of many changes in the life of the parents. Regardless of how many times a mother gives birth, each child brings new challenges and dynamics to the family. It is not unusual to have feelings of excitement along with confusing shifts in moods, emotions, and thoughts. All mothers are at risk of developing postpartum depression or the "baby blues." These mood changes can occur right after giving birth, or they may occur many months after giving birth. The baby blues or postpartum depression can be mild or severe. Additionally, postpartum depression can go away rather quickly, or it can be a long-term condition. What are the causes? Raised hormone levels and the rapid drop in those levels are thought to be a main cause of postpartum depression and the baby blues. A number of hormones change during and after pregnancy. Estrogen and progesterone usually decrease right after the delivery of your baby. The levels of thyroid hormone and various cortisol steroids also rapidly drop. Other factors that play a role in these mood changes include major life events and genetics. What increases the risk? If you have any of the following risks for the baby blues or postpartum  depression, know what symptoms to watch out for during the postpartum period. Risk factors that may increase the likelihood of getting the baby blues or postpartum depression include:  Having a personal or family history of depression.  Having depression while being pregnant.  Having premenstrual mood issues or mood issues related to oral contraceptives.  Having a lot of life stress.  Having marital conflict.  Lacking a social support network.  Having a baby with special needs.  Having health problems, such as diabetes.  What are the signs or symptoms? Symptoms of baby blues include:  Brief changes in mood, such as going from extreme happiness to sadness.  Decreased concentration.  Difficulty sleeping.  Crying spells, tearfulness.  Irritability.  Anxiety.  Symptoms of postpartum depression typically begin within the first month after giving birth. These symptoms include:  Difficulty sleeping or excessive sleepiness.  Marked weight loss.  Agitation.  Feelings of worthlessness.  Lack of interest in activity or food.  Postpartum psychosis is a very serious condition and can be dangerous. Fortunately, it is rare. Displaying any of the following symptoms is cause for immediate medical attention. Symptoms of postpartum psychosis include:  Hallucinations and delusions.  Bizarre or disorganized behavior.  Confusion or disorientation.  How is this diagnosed? A diagnosis is made by an evaluation of your symptoms. There are no medical or lab tests that lead to a diagnosis, but there are various questionnaires that a health care provider may  use to identify those with the baby blues, postpartum depression, or psychosis. Often, a screening tool called the [place] Postnatal Depression Scale is used to diagnose depression in the postpartum period. How is this treated? The baby blues usually goes away on its own in 1-2 weeks. Social support is often all that is needed. You  will be encouraged to get adequate sleep and rest. Occasionally, you may be given medicines to help you sleep. Postpartum depression requires treatment because it can last several months or longer if it is not treated. Treatment may include individual or group therapy, medicine, or both to address any social, physiological, and psychological factors that may play a role in the depression. Regular exercise, a healthy diet, rest, and social support may also be strongly recommended. Postpartum psychosis is more serious and needs treatment right away. Hospitalization is often needed. Follow these instructions at home:  Get as much rest as you can. Nap when the baby sleeps.  Exercise regularly. Some women find yoga and walking to be beneficial.  Eat a balanced and nourishing diet.  Do little things that you enjoy. Have a cup of tea, take a bubble bath, read your favorite magazine, or listen to your favorite music.  Avoid alcohol.  Ask for help with household chores, cooking, grocery shopping, or running errands as needed. Do not try to do everything.  Talk to people close to you about how you are feeling. Get support from your partner, family members, friends, or other new moms.  Try to stay positive in how you think. Think about the things you are grateful for.  Do not spend a lot of time alone.  Only take over-the-counter or prescription medicine as directed by your health care provider.  Keep all your postpartum appointments.  Let your health care provider know if you have any concerns. Contact a health care provider if: You are having a reaction to or problems with your medicine. Get help right away if:  You have suicidal feelings.  You think you may harm the baby or someone else. This information is not intended to replace advice given to you by your health care provider. Make sure you discuss any questions you have with your health care provider. Document Released: 03/31/2004  Document Revised: 12/03/2015 Document Reviewed: 04/08/2013 Elsevier Interactive Patient Education  2017 Elsevier Inc.   Breast Pumping Tips If you are breastfeeding, there may be times when you cannot feed your baby directly. Returning to work or going on a trip are examples. Pumping allows you to store breast milk and feed it to your baby later. You may not get much milk when you first start to pump. Your breasts should start to make more after a few days. If you pump at the times you usually feed your baby, you may be able to keep making enough milk to feed your baby without also using formula. The more often you pump, the more milk your body will make. When should I pump?  You can start to pump soon after you have your baby. Ask your doctor what is right for you and your baby.  If you are going back to work, start pumping a few weeks before. This gives you time to learn how to pump and to store a supply of milk.  When you are with your baby, feed your baby when he or she is hungry. Pump after each feeding.  When you are away from your baby for many hours, pump for about 15  minutes every 2-3 hours. Pump both breasts at the same time if you can.  If your baby has a formula feeding, make sure to pump close to the same time.  If you drink any alcohol, wait 2 hours before pumping. How do I get ready to pump? Your let-down reflex is your body's natural reaction that makes your breast milk flow. It is easier to make your breast milk flow when you are relaxed. Try these things to help you relax:  Smell one of your baby's blankets or an item of clothing.  Look at a picture or video of your baby.  Sit in a quiet, private space.  Massage the breast you plan to pump.  Place soothing warmth on the breast.  Play relaxing music.  What are some breast pumping tips?  Wash your hands before you pump. You do not need to wash your nipples or breasts.  There are three ways to pump. You  can: ? Use your hand to massage and squeeze your breast. ? Use a handheld manual pump. ? Use an electric pump.  Make sure the suction cup on the breast pump is the right size. Place the suction cup directly over the nipple. It can be painful or hurt your nipple if it is the wrong size or placed wrong.  Put a small amount of purified or modified lanolin on your nipple and areola if you are sore.  If you are using an electric pump, change the speed and suction power to be more comfortable.  You may need a different type of pump if pumping hurts or you do not get a lot of milk. Your doctor can help you pick what type of pump to use.  Keep a full water bottle near you always. Drinking lots of fluid helps you make more milk.  You can store your milk to use later. Pumped breast milk can be stored in a sealable, sterile container or plastic bag. Always put the date you pumped it on the container. ? Milk can stay out at room temperature for up to 8 hours. ? You can store your milk in the refrigerator for up to 8 days. ? You can store your milk in the freezer for 3 months. Thaw frozen milk using warm water. Do not put it in the microwave.  Do not smoke. Ask your doctor for help. When should I call my doctor?  You have a hard time pumping.  You are worried you do not make enough milk.  You have nipple pain, soreness, or redness.  You want to take birth control pills. This information is not intended to replace advice given to you by your health care provider. Make sure you discuss any questions you have with your health care provider. Document Released: 12/14/2007 Document Revised: 12/03/2015 Document Reviewed: 04/19/2013 Elsevier Interactive Patient Education  2017 [employer].

## 2018-01-24 NOTE — Discharge Summary (Signed)
Obstetric Discharge Summary  Patient ID: Anna ShoalsJennifer Harrell MRN: [id number] DOB/AGE: 16-Oct-1975 42 y.o.   Date of Admission: 01/21/2018  Date of Discharge:  01/24/18  Admitting Diagnosis: Induction of labor at 3655w5d  Secondary Diagnosis: Preeclampsia  Mode of Delivery: normal spontaneous vaginal delivery     Discharge Diagnosis: No other diagnosis   Intrapartum Procedures: Atificial rupture of membranes, epidural and pitocin augmentation   Post partum procedures: None  Complications: Second  degree perineal laceration and right labial laceration   Brief Hospital Course   Anna ShoalsJennifer Harrell is a [id number] who had a SVD on 0715;  for further details of this birth, please refer to the delivey note.  Patient's postpartum course included IV magnesium sulfate for the first 24 hour postpartum.  By time of discharge on PPD#2, her pain was controlled on oral pain medications; she had appropriate lochia and was ambulating, voiding without difficulty and tolerating regular diet.  She was deemed stable for discharge to home.    Labs: CBC Latest Ref Rng & Units 01/23/2018 01/22/2018 01/22/2018  WBC 4.0 - 10.5 K/uL 19.5(H) 19.9(H) 17.1(H)  Hemoglobin 12.0 - 15.0 g/dL 11.4(L) 13.0 12.8  Hematocrit 36.0 - 46.0 % 33.1(L) 38.2 37.2  Platelets 150 - 400 K/uL 201 242 223   Conflict (See Lab Report): O POS/O POS Performed at [facility], [address] [place], [place] [address]   Physical exam:   Temp:  [97.9 F (36.6 C)-98 F (36.7 C)] 98 F (36.7 C) (07/17 0500) Pulse Rate:  [78-85] 78 (07/17 0500) Resp:  [16-18] 18 (07/17 0500) BP: (108-126)/(85-89) 126/89 (07/17 0500) SpO2:  [100 %] 100 % (07/17 0500)  General: alert and no distress  Lochia: appropriate  Abdomen: soft, NT  Uterine Fundus: firm  Perineum: healing well, no significant drainage, no dehiscence, no significant erythema  Extremities: No evidence of DVT seen on physical exam.   Discharge Instructions: Per After  Visit Summary.  Activity: Advance as tolerated. Pelvic rest for 6 weeks.  Also refer to After Visit Summary  Diet: Regular  Medications: Allergies as of 01/24/2018   No Known Allergies     Medication List    STOP taking these medications   hydrOXYzine 50 MG capsule Commonly known as:  VISTARIL     TAKE these medications   acetaminophen 325 MG tablet Commonly known as:  TYLENOL Take 2 tablets (650 mg total) by mouth every 4 (four) hours as needed (for pain scale < 4).   dibucaine 1 % Oint Commonly known as:  NUPERCAINAL Place 1 application rectally as needed for hemorrhoids.   hydrocortisone 2.5 % rectal cream Commonly known as:  ANUSOL-HC Place rectally 4 (four) times daily.   ibuprofen 600 MG tablet Commonly known as:  ADVIL,MOTRIN Take 1 tablet (600 mg total) by mouth every 6 (six) hours.   multivitamin-prenatal 27-0.8 MG Tabs tablet Take 1 tablet by mouth daily at 12 noon.   witch hazel-glycerin pad Commonly known as:  TUCKS Apply 1 application topically as needed for hemorrhoids.      Outpatient follow up:  Follow-up Information    [facility] Follow up.   Why:  The office will call you to schedule a Blood Pressure check on Friday Contact information: [address] [place] [address] 667 [phone]        Shea EvansMody, Vaishali, MD Follow up.   Specialty:  Obstetrics and Gynecology Why:  The office will call you to schedule a postpartum visit with Dr. Talbert ForestMody Contact information: [address]  [place] [address] [phone]           Discharged Condition: stable  Discharged to: home   Newborn Data: Disposition:home with mother  Apgars: APGAR (1 MIN): 9   APGAR (5 MINS): 9   APGAR (10 MINS):    Baby Feeding: Breast   Gunnar Bulla, CNM [facility] 01/24/18 11:13 AM

## 2018-01-24 NOTE — Lactation Note (Signed)
This note was copied from a baby's chart. Lactation Consultation Note  Patient Name: Anna Peggyann ShoalsJennifer Winger WUJWJ'XToday's Date: 01/24/2018 Reason for consult: Follow-up assessment  Mom had called out for latch check. "[name]" was not actively suckling when I walked in the room. Parents were shown how to lower mandible. Infant began suckling and she was found to have a suck:swallow ratio of 1:1 (swallows verified by cervical auscultation). Mom was more comfortable with the mandible lowered. I also showed Dad the difference in the width of her gape. Specifics of an asymmetric latch shown via [employer] website animation to ensure optimal latch.   Parents were taught sound of swallowing, which they can now recognize. Signs of satiety discussed. Sometimes, [name]'s pauses between sucking bursts seem longer than what is typically expected. Parents were taught how to stimulate her so that milk transfer can continue (rub infant; rub acupressure point at base of thumb; lift breast from chin, etc.)   I reweighed [name] as I wondered if the weight at 0530 this morning was valid. The new weight showed an additional weight loss of 1.2 oz (now at 8.3% below BW). However, since the 0530 weight, [name] has had a large stool & large void (per parent report).   Overall, I am very pleased with how well [name] is nursing. She is obviously transferring milk, with some longer pauses in between sucking bursts. I did explain that if [name] were, for some reason, not to continue breastfeeding well once at home, Mom could express her milk and spoon-feed to infant (which was explained). Mom feels very comfortable with hand expression now that she has learned a new technique. Mom knows that if she were unable to express her milk (and if [name] were no longer continuing to feed well), then formula would need to be used.   Message left for Dr. Juanito DoomAgbuya @ [facility] to give me a call @ [phone].   Lurline HareRichey, Jamillia Closson [facility] 01/24/2018, 1:13 PM

## 2018-01-26 DIAGNOSIS — O1405 Mild to moderate pre-eclampsia, complicating the puerperium: Secondary | ICD-10-CM | POA: Diagnosis not present

## 2018-02-02 DIAGNOSIS — Z0130 Encounter for examination of blood pressure without abnormal findings: Secondary | ICD-10-CM | POA: Diagnosis not present

## 2018-02-02 DIAGNOSIS — O1494 Unspecified pre-eclampsia, complicating childbirth: Secondary | ICD-10-CM | POA: Diagnosis not present

## 2018-02-02 DIAGNOSIS — F419 Anxiety disorder, unspecified: Secondary | ICD-10-CM | POA: Diagnosis not present

## 2018-03-05 DIAGNOSIS — F419 Anxiety disorder, unspecified: Secondary | ICD-10-CM | POA: Diagnosis not present

## 2018-03-05 DIAGNOSIS — Z1151 Encounter for screening for human papillomavirus (HPV): Secondary | ICD-10-CM | POA: Diagnosis not present

## 2018-03-05 DIAGNOSIS — R030 Elevated blood-pressure reading, without diagnosis of hypertension: Secondary | ICD-10-CM | POA: Diagnosis not present

## 2018-03-05 DIAGNOSIS — Z124 Encounter for screening for malignant neoplasm of cervix: Secondary | ICD-10-CM | POA: Diagnosis not present

## 2018-03-07 DIAGNOSIS — F4323 Adjustment disorder with mixed anxiety and depressed mood: Secondary | ICD-10-CM | POA: Diagnosis not present

## 2018-03-15 DIAGNOSIS — F4323 Adjustment disorder with mixed anxiety and depressed mood: Secondary | ICD-10-CM | POA: Diagnosis not present

## 2018-03-21 DIAGNOSIS — M9901 Segmental and somatic dysfunction of cervical region: Secondary | ICD-10-CM | POA: Diagnosis not present

## 2018-03-21 DIAGNOSIS — M47812 Spondylosis without myelopathy or radiculopathy, cervical region: Secondary | ICD-10-CM | POA: Diagnosis not present

## 2018-03-21 DIAGNOSIS — M40202 Unspecified kyphosis, cervical region: Secondary | ICD-10-CM | POA: Diagnosis not present

## 2018-03-21 DIAGNOSIS — M5032 Other cervical disc degeneration, mid-cervical region, unspecified level: Secondary | ICD-10-CM | POA: Diagnosis not present

## 2018-03-22 DIAGNOSIS — M9901 Segmental and somatic dysfunction of cervical region: Secondary | ICD-10-CM | POA: Diagnosis not present

## 2018-03-22 DIAGNOSIS — M5032 Other cervical disc degeneration, mid-cervical region, unspecified level: Secondary | ICD-10-CM | POA: Diagnosis not present

## 2018-03-22 DIAGNOSIS — M47812 Spondylosis without myelopathy or radiculopathy, cervical region: Secondary | ICD-10-CM | POA: Diagnosis not present

## 2018-03-22 DIAGNOSIS — M40202 Unspecified kyphosis, cervical region: Secondary | ICD-10-CM | POA: Diagnosis not present

## 2018-03-26 DIAGNOSIS — M9901 Segmental and somatic dysfunction of cervical region: Secondary | ICD-10-CM | POA: Diagnosis not present

## 2018-03-26 DIAGNOSIS — M47812 Spondylosis without myelopathy or radiculopathy, cervical region: Secondary | ICD-10-CM | POA: Diagnosis not present

## 2018-03-26 DIAGNOSIS — M5032 Other cervical disc degeneration, mid-cervical region, unspecified level: Secondary | ICD-10-CM | POA: Diagnosis not present

## 2018-03-26 DIAGNOSIS — M40202 Unspecified kyphosis, cervical region: Secondary | ICD-10-CM | POA: Diagnosis not present

## 2018-03-28 DIAGNOSIS — I10 Essential (primary) hypertension: Secondary | ICD-10-CM | POA: Diagnosis not present

## 2018-03-28 DIAGNOSIS — M5032 Other cervical disc degeneration, mid-cervical region, unspecified level: Secondary | ICD-10-CM | POA: Diagnosis not present

## 2018-03-28 DIAGNOSIS — M9901 Segmental and somatic dysfunction of cervical region: Secondary | ICD-10-CM | POA: Diagnosis not present

## 2018-03-28 DIAGNOSIS — M40202 Unspecified kyphosis, cervical region: Secondary | ICD-10-CM | POA: Diagnosis not present

## 2018-03-28 DIAGNOSIS — M47812 Spondylosis without myelopathy or radiculopathy, cervical region: Secondary | ICD-10-CM | POA: Diagnosis not present

## 2018-03-29 DIAGNOSIS — M47812 Spondylosis without myelopathy or radiculopathy, cervical region: Secondary | ICD-10-CM | POA: Diagnosis not present

## 2018-03-29 DIAGNOSIS — M9901 Segmental and somatic dysfunction of cervical region: Secondary | ICD-10-CM | POA: Diagnosis not present

## 2018-03-29 DIAGNOSIS — M5032 Other cervical disc degeneration, mid-cervical region, unspecified level: Secondary | ICD-10-CM | POA: Diagnosis not present

## 2018-03-29 DIAGNOSIS — M40202 Unspecified kyphosis, cervical region: Secondary | ICD-10-CM | POA: Diagnosis not present

## 2018-04-02 DIAGNOSIS — M9901 Segmental and somatic dysfunction of cervical region: Secondary | ICD-10-CM | POA: Diagnosis not present

## 2018-04-02 DIAGNOSIS — M5032 Other cervical disc degeneration, mid-cervical region, unspecified level: Secondary | ICD-10-CM | POA: Diagnosis not present

## 2018-04-02 DIAGNOSIS — M47812 Spondylosis without myelopathy or radiculopathy, cervical region: Secondary | ICD-10-CM | POA: Diagnosis not present

## 2018-04-02 DIAGNOSIS — M40202 Unspecified kyphosis, cervical region: Secondary | ICD-10-CM | POA: Diagnosis not present

## 2018-04-04 DIAGNOSIS — M9901 Segmental and somatic dysfunction of cervical region: Secondary | ICD-10-CM | POA: Diagnosis not present

## 2018-04-04 DIAGNOSIS — M40202 Unspecified kyphosis, cervical region: Secondary | ICD-10-CM | POA: Diagnosis not present

## 2018-04-04 DIAGNOSIS — M5032 Other cervical disc degeneration, mid-cervical region, unspecified level: Secondary | ICD-10-CM | POA: Diagnosis not present

## 2018-04-04 DIAGNOSIS — M47812 Spondylosis without myelopathy or radiculopathy, cervical region: Secondary | ICD-10-CM | POA: Diagnosis not present

## 2018-04-05 DIAGNOSIS — M40202 Unspecified kyphosis, cervical region: Secondary | ICD-10-CM | POA: Diagnosis not present

## 2018-04-05 DIAGNOSIS — M47812 Spondylosis without myelopathy or radiculopathy, cervical region: Secondary | ICD-10-CM | POA: Diagnosis not present

## 2018-04-05 DIAGNOSIS — M5032 Other cervical disc degeneration, mid-cervical region, unspecified level: Secondary | ICD-10-CM | POA: Diagnosis not present

## 2018-04-05 DIAGNOSIS — M9901 Segmental and somatic dysfunction of cervical region: Secondary | ICD-10-CM | POA: Diagnosis not present

## 2018-04-09 DIAGNOSIS — M47812 Spondylosis without myelopathy or radiculopathy, cervical region: Secondary | ICD-10-CM | POA: Diagnosis not present

## 2018-04-09 DIAGNOSIS — M40202 Unspecified kyphosis, cervical region: Secondary | ICD-10-CM | POA: Diagnosis not present

## 2018-04-09 DIAGNOSIS — M9901 Segmental and somatic dysfunction of cervical region: Secondary | ICD-10-CM | POA: Diagnosis not present

## 2018-04-09 DIAGNOSIS — M5032 Other cervical disc degeneration, mid-cervical region, unspecified level: Secondary | ICD-10-CM | POA: Diagnosis not present

## 2018-04-11 DIAGNOSIS — M47812 Spondylosis without myelopathy or radiculopathy, cervical region: Secondary | ICD-10-CM | POA: Diagnosis not present

## 2018-04-11 DIAGNOSIS — M40202 Unspecified kyphosis, cervical region: Secondary | ICD-10-CM | POA: Diagnosis not present

## 2018-04-11 DIAGNOSIS — M9901 Segmental and somatic dysfunction of cervical region: Secondary | ICD-10-CM | POA: Diagnosis not present

## 2018-04-11 DIAGNOSIS — M5032 Other cervical disc degeneration, mid-cervical region, unspecified level: Secondary | ICD-10-CM | POA: Diagnosis not present

## 2018-04-12 DIAGNOSIS — M40202 Unspecified kyphosis, cervical region: Secondary | ICD-10-CM | POA: Diagnosis not present

## 2018-04-12 DIAGNOSIS — M5032 Other cervical disc degeneration, mid-cervical region, unspecified level: Secondary | ICD-10-CM | POA: Diagnosis not present

## 2018-04-12 DIAGNOSIS — M47812 Spondylosis without myelopathy or radiculopathy, cervical region: Secondary | ICD-10-CM | POA: Diagnosis not present

## 2018-04-12 DIAGNOSIS — M9901 Segmental and somatic dysfunction of cervical region: Secondary | ICD-10-CM | POA: Diagnosis not present

## 2018-04-13 DIAGNOSIS — Z23 Encounter for immunization: Secondary | ICD-10-CM | POA: Diagnosis not present

## 2018-04-13 DIAGNOSIS — F419 Anxiety disorder, unspecified: Secondary | ICD-10-CM | POA: Diagnosis not present

## 2018-04-13 DIAGNOSIS — I10 Essential (primary) hypertension: Secondary | ICD-10-CM | POA: Diagnosis not present

## 2018-04-16 DIAGNOSIS — M9901 Segmental and somatic dysfunction of cervical region: Secondary | ICD-10-CM | POA: Diagnosis not present

## 2018-04-16 DIAGNOSIS — M40202 Unspecified kyphosis, cervical region: Secondary | ICD-10-CM | POA: Diagnosis not present

## 2018-04-16 DIAGNOSIS — M5032 Other cervical disc degeneration, mid-cervical region, unspecified level: Secondary | ICD-10-CM | POA: Diagnosis not present

## 2018-04-16 DIAGNOSIS — M47812 Spondylosis without myelopathy or radiculopathy, cervical region: Secondary | ICD-10-CM | POA: Diagnosis not present

## 2018-04-18 DIAGNOSIS — M47812 Spondylosis without myelopathy or radiculopathy, cervical region: Secondary | ICD-10-CM | POA: Diagnosis not present

## 2018-04-18 DIAGNOSIS — M9901 Segmental and somatic dysfunction of cervical region: Secondary | ICD-10-CM | POA: Diagnosis not present

## 2018-04-18 DIAGNOSIS — M5032 Other cervical disc degeneration, mid-cervical region, unspecified level: Secondary | ICD-10-CM | POA: Diagnosis not present

## 2018-04-18 DIAGNOSIS — M40202 Unspecified kyphosis, cervical region: Secondary | ICD-10-CM | POA: Diagnosis not present

## 2018-04-23 DIAGNOSIS — M5032 Other cervical disc degeneration, mid-cervical region, unspecified level: Secondary | ICD-10-CM | POA: Diagnosis not present

## 2018-04-23 DIAGNOSIS — M9901 Segmental and somatic dysfunction of cervical region: Secondary | ICD-10-CM | POA: Diagnosis not present

## 2018-04-23 DIAGNOSIS — M40202 Unspecified kyphosis, cervical region: Secondary | ICD-10-CM | POA: Diagnosis not present

## 2018-04-23 DIAGNOSIS — M47812 Spondylosis without myelopathy or radiculopathy, cervical region: Secondary | ICD-10-CM | POA: Diagnosis not present

## 2018-04-25 DIAGNOSIS — M40202 Unspecified kyphosis, cervical region: Secondary | ICD-10-CM | POA: Diagnosis not present

## 2018-04-25 DIAGNOSIS — M5032 Other cervical disc degeneration, mid-cervical region, unspecified level: Secondary | ICD-10-CM | POA: Diagnosis not present

## 2018-04-25 DIAGNOSIS — M47812 Spondylosis without myelopathy or radiculopathy, cervical region: Secondary | ICD-10-CM | POA: Diagnosis not present

## 2018-04-25 DIAGNOSIS — M9901 Segmental and somatic dysfunction of cervical region: Secondary | ICD-10-CM | POA: Diagnosis not present

## 2018-04-30 DIAGNOSIS — M9901 Segmental and somatic dysfunction of cervical region: Secondary | ICD-10-CM | POA: Diagnosis not present

## 2018-04-30 DIAGNOSIS — M40202 Unspecified kyphosis, cervical region: Secondary | ICD-10-CM | POA: Diagnosis not present

## 2018-04-30 DIAGNOSIS — M47812 Spondylosis without myelopathy or radiculopathy, cervical region: Secondary | ICD-10-CM | POA: Diagnosis not present

## 2018-04-30 DIAGNOSIS — M5032 Other cervical disc degeneration, mid-cervical region, unspecified level: Secondary | ICD-10-CM | POA: Diagnosis not present

## 2018-05-03 DIAGNOSIS — M40202 Unspecified kyphosis, cervical region: Secondary | ICD-10-CM | POA: Diagnosis not present

## 2018-05-03 DIAGNOSIS — M47812 Spondylosis without myelopathy or radiculopathy, cervical region: Secondary | ICD-10-CM | POA: Diagnosis not present

## 2018-05-03 DIAGNOSIS — M5032 Other cervical disc degeneration, mid-cervical region, unspecified level: Secondary | ICD-10-CM | POA: Diagnosis not present

## 2018-05-03 DIAGNOSIS — M9901 Segmental and somatic dysfunction of cervical region: Secondary | ICD-10-CM | POA: Diagnosis not present

## 2018-05-07 DIAGNOSIS — M5032 Other cervical disc degeneration, mid-cervical region, unspecified level: Secondary | ICD-10-CM | POA: Diagnosis not present

## 2018-05-07 DIAGNOSIS — M9901 Segmental and somatic dysfunction of cervical region: Secondary | ICD-10-CM | POA: Diagnosis not present

## 2018-05-07 DIAGNOSIS — M40202 Unspecified kyphosis, cervical region: Secondary | ICD-10-CM | POA: Diagnosis not present

## 2018-05-07 DIAGNOSIS — M47812 Spondylosis without myelopathy or radiculopathy, cervical region: Secondary | ICD-10-CM | POA: Diagnosis not present

## 2018-05-10 DIAGNOSIS — M5032 Other cervical disc degeneration, mid-cervical region, unspecified level: Secondary | ICD-10-CM | POA: Diagnosis not present

## 2018-05-10 DIAGNOSIS — M47812 Spondylosis without myelopathy or radiculopathy, cervical region: Secondary | ICD-10-CM | POA: Diagnosis not present

## 2018-05-10 DIAGNOSIS — M40202 Unspecified kyphosis, cervical region: Secondary | ICD-10-CM | POA: Diagnosis not present

## 2018-05-10 DIAGNOSIS — M9901 Segmental and somatic dysfunction of cervical region: Secondary | ICD-10-CM | POA: Diagnosis not present

## 2018-05-14 DIAGNOSIS — M47812 Spondylosis without myelopathy or radiculopathy, cervical region: Secondary | ICD-10-CM | POA: Diagnosis not present

## 2018-05-14 DIAGNOSIS — M9901 Segmental and somatic dysfunction of cervical region: Secondary | ICD-10-CM | POA: Diagnosis not present

## 2018-05-14 DIAGNOSIS — M40202 Unspecified kyphosis, cervical region: Secondary | ICD-10-CM | POA: Diagnosis not present

## 2018-05-14 DIAGNOSIS — M5032 Other cervical disc degeneration, mid-cervical region, unspecified level: Secondary | ICD-10-CM | POA: Diagnosis not present

## 2018-09-04 DIAGNOSIS — L918 Other hypertrophic disorders of the skin: Secondary | ICD-10-CM | POA: Diagnosis not present

## 2018-09-04 DIAGNOSIS — I10 Essential (primary) hypertension: Secondary | ICD-10-CM | POA: Diagnosis not present

## 2018-09-04 DIAGNOSIS — R51 Headache: Secondary | ICD-10-CM | POA: Diagnosis not present

## 2018-09-05 ENCOUNTER — Other Ambulatory Visit: Payer: Self-pay | Admitting: Family Medicine

## 2018-09-05 DIAGNOSIS — R519 Headache, unspecified: Secondary | ICD-10-CM

## 2018-09-05 DIAGNOSIS — R51 Headache: Principal | ICD-10-CM

## 2018-11-13 ENCOUNTER — Other Ambulatory Visit: Payer: BLUE CROSS/BLUE SHIELD

## 2018-11-16 ENCOUNTER — Ambulatory Visit
Admission: RE | Admit: 2018-11-16 | Discharge: 2018-11-16 | Disposition: A | Discharge status: Home / Self Care | Payer: BLUE CROSS/BLUE SHIELD | Source: Ambulatory Visit | Attending: Family Medicine | Admitting: Family Medicine

## 2018-11-16 ENCOUNTER — Other Ambulatory Visit: Payer: Self-pay

## 2018-11-16 DIAGNOSIS — R519 Headache, unspecified: Secondary | ICD-10-CM

## 2018-11-16 DIAGNOSIS — R51 Headache: Secondary | ICD-10-CM | POA: Diagnosis not present

## 2018-12-17 DIAGNOSIS — F4320 Adjustment disorder, unspecified: Secondary | ICD-10-CM | POA: Diagnosis not present

## 2019-01-09 DIAGNOSIS — F4320 Adjustment disorder, unspecified: Secondary | ICD-10-CM | POA: Diagnosis not present

## 2019-01-16 DIAGNOSIS — F4320 Adjustment disorder, unspecified: Secondary | ICD-10-CM | POA: Diagnosis not present

## 2019-01-30 DIAGNOSIS — F4320 Adjustment disorder, unspecified: Secondary | ICD-10-CM | POA: Diagnosis not present

## 2019-02-06 DIAGNOSIS — F4320 Adjustment disorder, unspecified: Secondary | ICD-10-CM | POA: Diagnosis not present

## 2019-02-13 DIAGNOSIS — F411 Generalized anxiety disorder: Secondary | ICD-10-CM | POA: Diagnosis not present

## 2019-02-20 DIAGNOSIS — F411 Generalized anxiety disorder: Secondary | ICD-10-CM | POA: Diagnosis not present

## 2019-02-27 DIAGNOSIS — F4320 Adjustment disorder, unspecified: Secondary | ICD-10-CM | POA: Diagnosis not present

## 2019-03-04 DIAGNOSIS — F419 Anxiety disorder, unspecified: Secondary | ICD-10-CM | POA: Diagnosis not present

## 2019-03-04 DIAGNOSIS — G43909 Migraine, unspecified, not intractable, without status migrainosus: Secondary | ICD-10-CM | POA: Diagnosis not present

## 2019-03-04 DIAGNOSIS — I10 Essential (primary) hypertension: Secondary | ICD-10-CM | POA: Diagnosis not present

## 2019-03-05 DIAGNOSIS — F4320 Adjustment disorder, unspecified: Secondary | ICD-10-CM | POA: Diagnosis not present

## 2019-03-12 DIAGNOSIS — F411 Generalized anxiety disorder: Secondary | ICD-10-CM | POA: Diagnosis not present

## 2019-03-19 DIAGNOSIS — F411 Generalized anxiety disorder: Secondary | ICD-10-CM | POA: Diagnosis not present

## 2019-03-27 DIAGNOSIS — Z23 Encounter for immunization: Secondary | ICD-10-CM | POA: Diagnosis not present

## 2019-04-01 DIAGNOSIS — F331 Major depressive disorder, recurrent, moderate: Secondary | ICD-10-CM | POA: Diagnosis not present

## 2019-04-02 DIAGNOSIS — F331 Major depressive disorder, recurrent, moderate: Secondary | ICD-10-CM | POA: Diagnosis not present

## 2019-04-09 DIAGNOSIS — F331 Major depressive disorder, recurrent, moderate: Secondary | ICD-10-CM | POA: Diagnosis not present

## 2019-04-23 DIAGNOSIS — F331 Major depressive disorder, recurrent, moderate: Secondary | ICD-10-CM | POA: Diagnosis not present

## 2019-04-30 DIAGNOSIS — F331 Major depressive disorder, recurrent, moderate: Secondary | ICD-10-CM | POA: Diagnosis not present

## 2019-05-07 DIAGNOSIS — F331 Major depressive disorder, recurrent, moderate: Secondary | ICD-10-CM | POA: Diagnosis not present

## 2019-05-14 DIAGNOSIS — F331 Major depressive disorder, recurrent, moderate: Secondary | ICD-10-CM | POA: Diagnosis not present

## 2019-05-21 DIAGNOSIS — F39 Unspecified mood [affective] disorder: Secondary | ICD-10-CM | POA: Diagnosis not present

## 2019-06-04 DIAGNOSIS — F331 Major depressive disorder, recurrent, moderate: Secondary | ICD-10-CM | POA: Diagnosis not present

## 2019-06-11 DIAGNOSIS — F331 Major depressive disorder, recurrent, moderate: Secondary | ICD-10-CM | POA: Diagnosis not present

## 2019-06-18 DIAGNOSIS — F331 Major depressive disorder, recurrent, moderate: Secondary | ICD-10-CM | POA: Diagnosis not present

## 2019-06-25 DIAGNOSIS — F331 Major depressive disorder, recurrent, moderate: Secondary | ICD-10-CM | POA: Diagnosis not present

## 2019-07-09 DIAGNOSIS — F331 Major depressive disorder, recurrent, moderate: Secondary | ICD-10-CM | POA: Diagnosis not present

## 2019-07-16 DIAGNOSIS — F331 Major depressive disorder, recurrent, moderate: Secondary | ICD-10-CM | POA: Diagnosis not present

## 2019-07-31 DIAGNOSIS — F411 Generalized anxiety disorder: Secondary | ICD-10-CM | POA: Diagnosis not present

## 2019-08-13 DIAGNOSIS — Z0000 Encounter for general adult medical examination without abnormal findings: Secondary | ICD-10-CM | POA: Diagnosis not present

## 2019-08-13 DIAGNOSIS — F331 Major depressive disorder, recurrent, moderate: Secondary | ICD-10-CM | POA: Diagnosis not present

## 2019-08-16 DIAGNOSIS — Z7189 Other specified counseling: Secondary | ICD-10-CM | POA: Diagnosis not present

## 2019-08-27 DIAGNOSIS — Z13220 Encounter for screening for lipoid disorders: Secondary | ICD-10-CM | POA: Diagnosis not present

## 2019-08-27 DIAGNOSIS — Z130 Encounter for screening for diseases of the blood and blood-forming organs and certain disorders involving the immune mechanism: Secondary | ICD-10-CM | POA: Diagnosis not present

## 2019-08-27 DIAGNOSIS — Z1329 Encounter for screening for other suspected endocrine disorder: Secondary | ICD-10-CM | POA: Diagnosis not present

## 2019-08-29 DIAGNOSIS — F331 Major depressive disorder, recurrent, moderate: Secondary | ICD-10-CM | POA: Diagnosis not present

## 2019-09-02 DIAGNOSIS — F331 Major depressive disorder, recurrent, moderate: Secondary | ICD-10-CM | POA: Diagnosis not present

## 2019-09-10 DIAGNOSIS — Z1231 Encounter for screening mammogram for malignant neoplasm of breast: Secondary | ICD-10-CM | POA: Diagnosis not present

## 2019-09-10 DIAGNOSIS — Z01419 Encounter for gynecological examination (general) (routine) without abnormal findings: Secondary | ICD-10-CM | POA: Diagnosis not present

## 2019-09-10 DIAGNOSIS — Z6821 Body mass index (BMI) 21.0-21.9, adult: Secondary | ICD-10-CM | POA: Diagnosis not present

## 2019-09-10 DIAGNOSIS — Z3009 Encounter for other general counseling and advice on contraception: Secondary | ICD-10-CM | POA: Diagnosis not present

## 2019-09-11 DIAGNOSIS — Z23 Encounter for immunization: Secondary | ICD-10-CM | POA: Diagnosis not present

## 2019-09-17 DIAGNOSIS — F411 Generalized anxiety disorder: Secondary | ICD-10-CM | POA: Diagnosis not present

## 2019-09-24 DIAGNOSIS — F411 Generalized anxiety disorder: Secondary | ICD-10-CM | POA: Diagnosis not present

## 2019-10-01 DIAGNOSIS — F411 Generalized anxiety disorder: Secondary | ICD-10-CM | POA: Diagnosis not present

## 2019-10-08 DIAGNOSIS — F411 Generalized anxiety disorder: Secondary | ICD-10-CM | POA: Diagnosis not present

## 2019-10-09 DIAGNOSIS — Z23 Encounter for immunization: Secondary | ICD-10-CM | POA: Diagnosis not present

## 2019-10-15 DIAGNOSIS — F331 Major depressive disorder, recurrent, moderate: Secondary | ICD-10-CM | POA: Diagnosis not present

## 2019-10-22 DIAGNOSIS — F411 Generalized anxiety disorder: Secondary | ICD-10-CM | POA: Diagnosis not present

## 2019-10-29 DIAGNOSIS — F411 Generalized anxiety disorder: Secondary | ICD-10-CM | POA: Diagnosis not present

## 2019-11-05 DIAGNOSIS — F411 Generalized anxiety disorder: Secondary | ICD-10-CM | POA: Diagnosis not present

## 2019-12-13 DIAGNOSIS — Z3689 Encounter for other specified antenatal screening: Secondary | ICD-10-CM | POA: Diagnosis not present

## 2019-12-13 DIAGNOSIS — Z3200 Encounter for pregnancy test, result unknown: Secondary | ICD-10-CM | POA: Diagnosis not present

## 2019-12-27 DIAGNOSIS — Z3201 Encounter for pregnancy test, result positive: Secondary | ICD-10-CM | POA: Diagnosis not present

## 2020-01-08 DIAGNOSIS — Z3689 Encounter for other specified antenatal screening: Secondary | ICD-10-CM | POA: Diagnosis not present

## 2020-01-08 DIAGNOSIS — O0900 Supervision of pregnancy with history of infertility, unspecified trimester: Secondary | ICD-10-CM | POA: Diagnosis not present

## 2020-01-08 DIAGNOSIS — Z3A10 10 weeks gestation of pregnancy: Secondary | ICD-10-CM | POA: Diagnosis not present

## 2020-01-08 DIAGNOSIS — O131 Gestational [pregnancy-induced] hypertension without significant proteinuria, first trimester: Secondary | ICD-10-CM | POA: Diagnosis not present

## 2020-01-08 DIAGNOSIS — O09521 Supervision of elderly multigravida, first trimester: Secondary | ICD-10-CM | POA: Diagnosis not present

## 2020-01-08 DIAGNOSIS — Z3A09 9 weeks gestation of pregnancy: Secondary | ICD-10-CM | POA: Diagnosis not present

## 2020-01-08 DIAGNOSIS — Z3483 Encounter for supervision of other normal pregnancy, third trimester: Secondary | ICD-10-CM | POA: Diagnosis not present

## 2020-01-08 DIAGNOSIS — O1494 Unspecified pre-eclampsia, complicating childbirth: Secondary | ICD-10-CM | POA: Diagnosis not present

## 2020-01-16 DIAGNOSIS — O09521 Supervision of elderly multigravida, first trimester: Secondary | ICD-10-CM | POA: Diagnosis not present

## 2020-01-16 DIAGNOSIS — O09529 Supervision of elderly multigravida, unspecified trimester: Secondary | ICD-10-CM | POA: Diagnosis not present

## 2020-01-16 DIAGNOSIS — Z3A10 10 weeks gestation of pregnancy: Secondary | ICD-10-CM | POA: Diagnosis not present

## 2020-01-21 DIAGNOSIS — O131 Gestational [pregnancy-induced] hypertension without significant proteinuria, first trimester: Secondary | ICD-10-CM | POA: Diagnosis not present

## 2020-01-21 DIAGNOSIS — Z3A11 11 weeks gestation of pregnancy: Secondary | ICD-10-CM | POA: Diagnosis not present

## 2020-02-05 DIAGNOSIS — Z3A13 13 weeks gestation of pregnancy: Secondary | ICD-10-CM | POA: Diagnosis not present

## 2020-02-05 DIAGNOSIS — O131 Gestational [pregnancy-induced] hypertension without significant proteinuria, first trimester: Secondary | ICD-10-CM | POA: Diagnosis not present

## 2020-02-05 DIAGNOSIS — O09521 Supervision of elderly multigravida, first trimester: Secondary | ICD-10-CM | POA: Diagnosis not present

## 2020-02-27 DIAGNOSIS — O09522 Supervision of elderly multigravida, second trimester: Secondary | ICD-10-CM | POA: Diagnosis not present

## 2020-02-27 DIAGNOSIS — O0900 Supervision of pregnancy with history of infertility, unspecified trimester: Secondary | ICD-10-CM | POA: Diagnosis not present

## 2020-02-27 DIAGNOSIS — Z3A16 16 weeks gestation of pregnancy: Secondary | ICD-10-CM | POA: Diagnosis not present

## 2020-02-27 DIAGNOSIS — Z361 Encounter for antenatal screening for raised alphafetoprotein level: Secondary | ICD-10-CM | POA: Diagnosis not present

## 2020-03-18 DIAGNOSIS — Z3A19 19 weeks gestation of pregnancy: Secondary | ICD-10-CM | POA: Diagnosis not present

## 2020-03-18 DIAGNOSIS — O09522 Supervision of elderly multigravida, second trimester: Secondary | ICD-10-CM | POA: Diagnosis not present

## 2020-04-13 DIAGNOSIS — O09522 Supervision of elderly multigravida, second trimester: Secondary | ICD-10-CM | POA: Diagnosis not present

## 2020-04-13 DIAGNOSIS — Z3A23 23 weeks gestation of pregnancy: Secondary | ICD-10-CM | POA: Diagnosis not present

## 2020-04-14 DIAGNOSIS — Z23 Encounter for immunization: Secondary | ICD-10-CM | POA: Diagnosis not present

## 2020-05-03 IMAGING — MR MRI HEAD WITHOUT CONTRAST
10 series · 48 of 48 positions shown · non-contrast
Comparison: None available.

CLINICAL DATA: Initial evaluation for intermittent recurrent
headaches for 1 year.

EXAM:
MRI HEAD WITHOUT CONTRAST
TECHNIQUE: Multiplanar, multiecho pulse sequences of the brain and surrounding
structures were obtained without intravenous contrast.

[Series 6: DWI · axial · 3.0mm · 1.44mm/px · z∈[-55,+93]mm · 7 of 92 slices shown (1 of 4)]
[im 1/92]
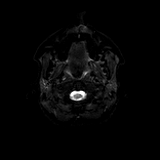
[im 16/92]
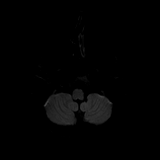
[im 31/92]
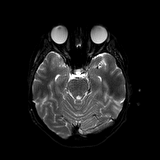
[im 46/92]
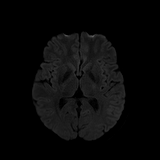
[im 61/92]
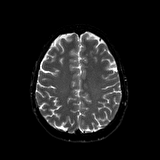
[im 76/92]
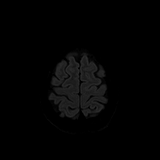
[im 92/92]
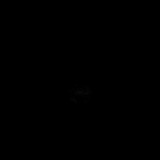

[Series 7: DWI · axial · 3.0mm · 1.44mm/px · z∈[-55,+93]mm · 4 of 46 slices shown (2 of 4)]
[im 1/46]
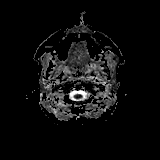
[im 16/46]
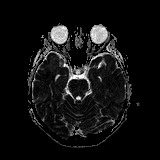
[im 31/46]
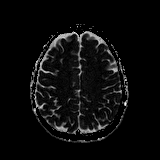
[im 46/46]
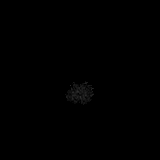

[Series 8: DWI · coronal · 5.0mm · 1.44mm/px · 5 of 66 slices shown (3 of 4)]
[im 1/66]
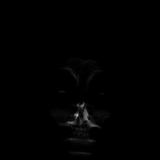
[im 17/66]
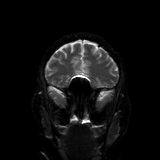
[im 33/66]
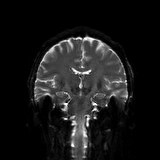
[im 49/66]
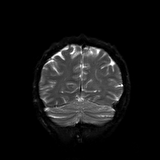
[im 66/66]
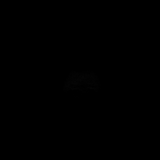

[Series 9: DWI · coronal · 5.0mm · 1.44mm/px · 3 of 33 slices shown (4 of 4)]
[im 1/33]
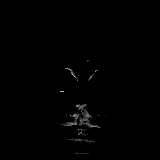
[im 17/33]
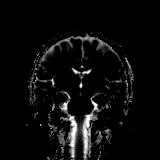
[im 33/33]
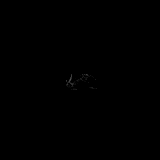

[Series 10: T2 · axial · 4.0mm · 0.36mm/px · z∈[-59,+92]mm · 2 of 30 slices shown (1 of 2)]
[im 1/30]
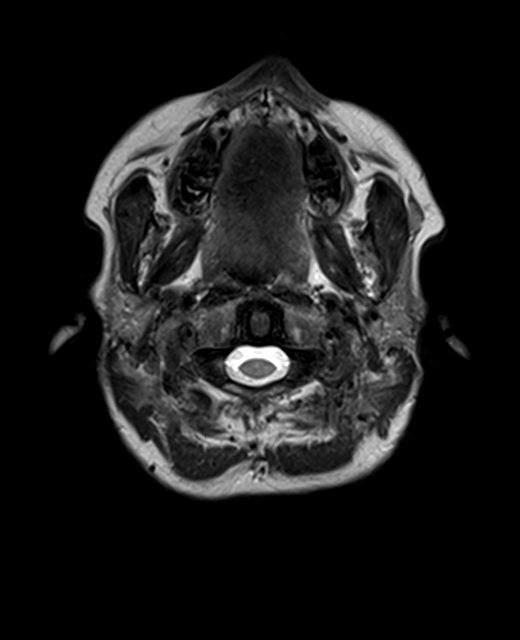
[im 30/30]
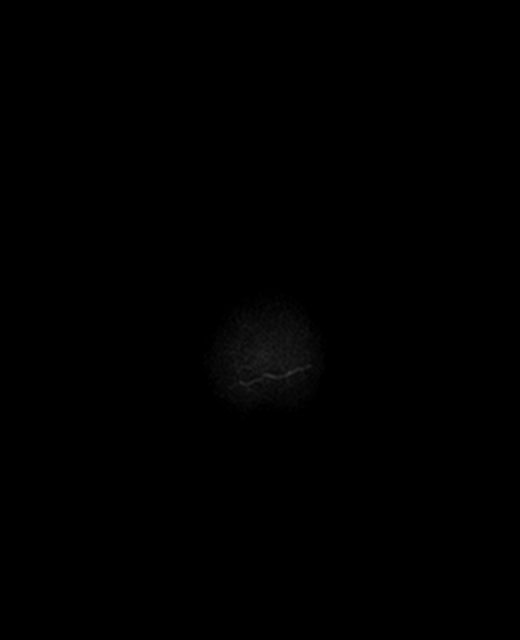

[Series 11: FLAIR · axial · 3.0mm · 0.72mm/px · z∈[-59,+91]mm · 2 of 26 slices shown]
[im 1/26]
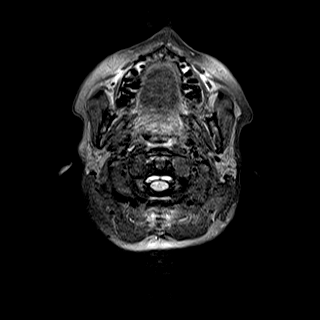
[im 26/26]
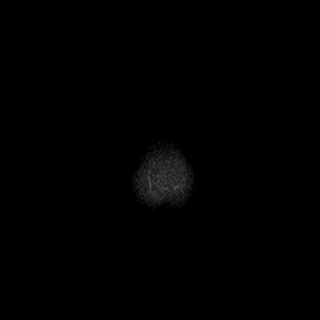

[Series 13: swi_images · axial · 1.5mm · 0.90mm/px · z∈[-55,+87]mm · 8 of 96 slices shown]
[im 1/96]
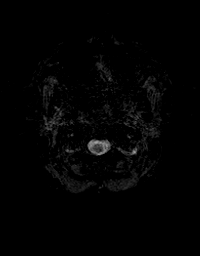
[im 14/96]
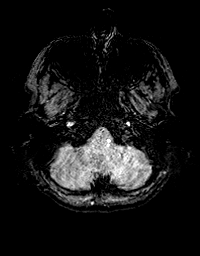
[im 28/96]
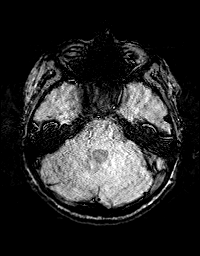
[im 41/96]
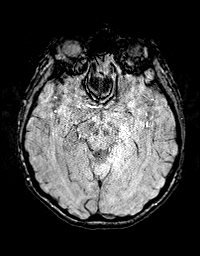
[im 55/96]
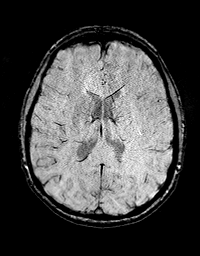
[im 68/96]
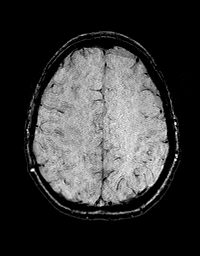
[im 82/96]
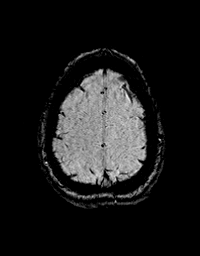
[im 96/96]
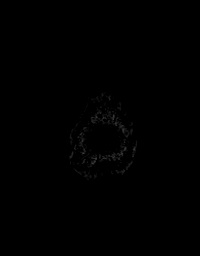

[Series 14: T1 · axial · 1.0mm · 0.90mm/px · z∈[-63,+95]mm · 12 of 159 slices shown (1 of 2)]
[im 1/159]
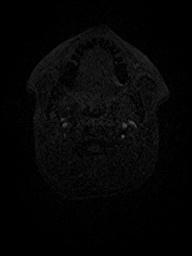
[im 15/159]
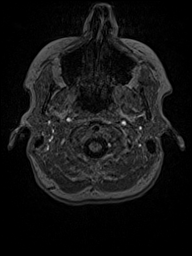
[im 29/159]
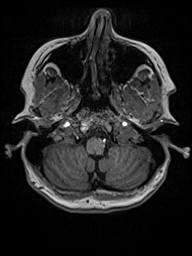
[im 44/159]
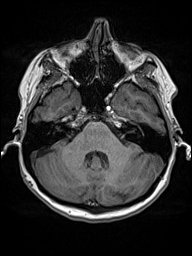
[im 58/159]
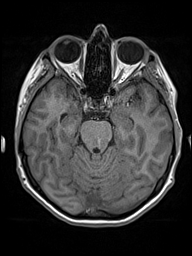
[im 72/159]
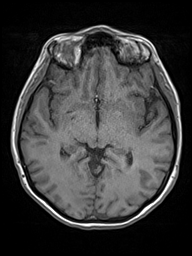
[im 87/159]
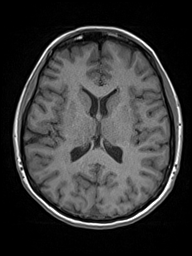
[im 101/159]
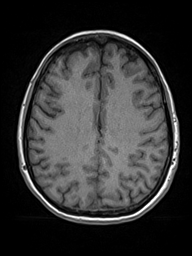
[im 115/159]
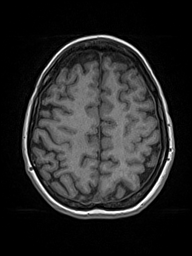
[im 130/159]
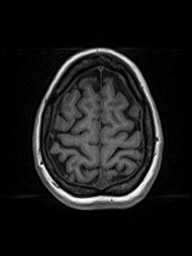
[im 144/159]
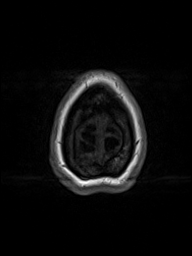
[im 159/159]
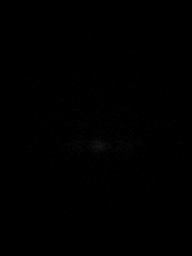

[Series 15: T2 · coronal · 4.5mm · 0.36mm/px · 3 of 33 slices shown (2 of 2)]
[im 1/33]
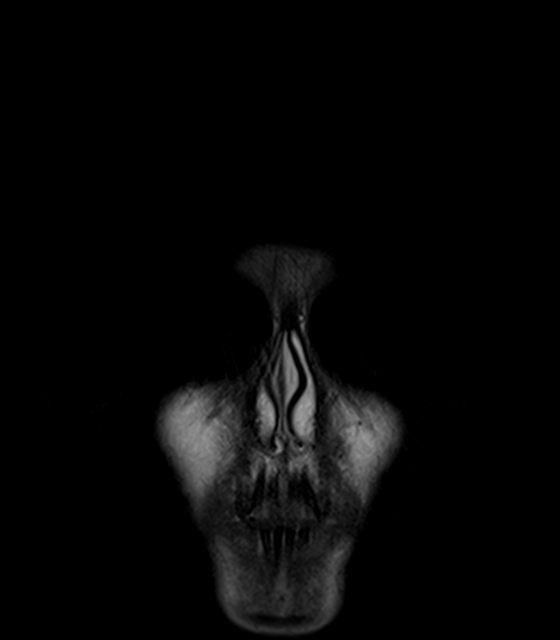
[im 17/33]
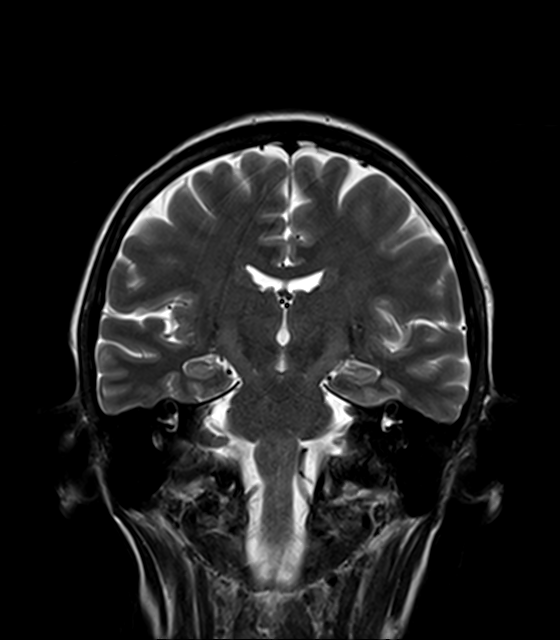
[im 33/33]
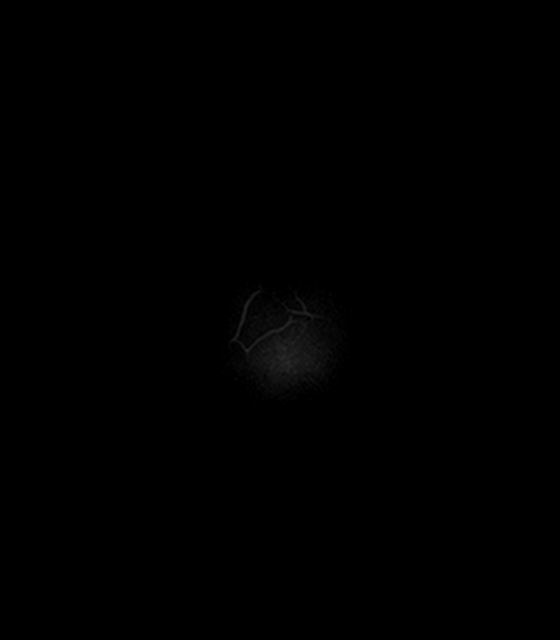

[Series 16: T1 · sagittal · 4.0mm · 0.75mm/px · 2 of 31 slices shown (2 of 2)]
[im 1/31]
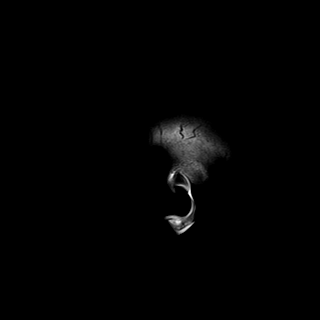
[im 31/31]
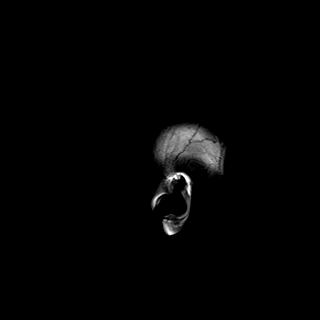

[48 of 48 positions shown; findings below may reference images not displayed]

FINDINGS: Brain: Cerebral volume within normal limits for patient age. No
focal parenchymal signal abnormality identified. Apparent
heterogeneous T1 hyperintensity at the posterior aspect of the pons
seen on axial T1 weighted images 46-53 favored to be artifactual
nature, with no corresponding signal abnormality seen on additional
sequences.

No abnormal foci of restricted diffusion to suggest acute or
subacute ischemia. Gray-white matter differentiation well
maintained. No encephalomalacia to suggest chronic infarction. No
foci of susceptibility artifact to suggest acute or chronic
intracranial hemorrhage.

No mass lesion, midline shift or mass effect. No hydrocephalus. No
extra-axial fluid collection. Major dural sinuses are grossly
patent.

Pituitary gland and suprasellar region are normal. Midline
structures intact and normal.

Vascular: Major intracranial vascular flow voids well maintained and
normal in appearance.

Skull and upper cervical spine: Craniocervical junction normal.
Visualized upper cervical spine within normal limits. Bone marrow
signal intensity normal. No scalp soft tissue abnormality.

Sinuses/Orbits: Globes and orbital soft tissues within normal
limits.

Paranasal sinuses are clear. No mastoid effusion. Inner ear
structures normal.

Other: None.
IMPRESSION: Normal MRI of the brain. No acute intracranial abnormality
identified.

## 2020-05-04 DIAGNOSIS — R42 Dizziness and giddiness: Secondary | ICD-10-CM | POA: Diagnosis not present

## 2020-05-04 DIAGNOSIS — O09522 Supervision of elderly multigravida, second trimester: Secondary | ICD-10-CM | POA: Diagnosis not present

## 2020-05-04 DIAGNOSIS — Z3A26 26 weeks gestation of pregnancy: Secondary | ICD-10-CM | POA: Diagnosis not present

## 2020-05-19 DIAGNOSIS — Z3A28 28 weeks gestation of pregnancy: Secondary | ICD-10-CM | POA: Diagnosis not present

## 2020-05-19 DIAGNOSIS — Z23 Encounter for immunization: Secondary | ICD-10-CM | POA: Diagnosis not present

## 2020-05-19 DIAGNOSIS — Z3689 Encounter for other specified antenatal screening: Secondary | ICD-10-CM | POA: Diagnosis not present

## 2020-05-19 DIAGNOSIS — O09523 Supervision of elderly multigravida, third trimester: Secondary | ICD-10-CM | POA: Diagnosis not present

## 2020-05-29 DIAGNOSIS — Z3A29 29 weeks gestation of pregnancy: Secondary | ICD-10-CM | POA: Diagnosis not present

## 2020-05-29 DIAGNOSIS — O09523 Supervision of elderly multigravida, third trimester: Secondary | ICD-10-CM | POA: Diagnosis not present

## 2020-06-12 DIAGNOSIS — Z3A31 31 weeks gestation of pregnancy: Secondary | ICD-10-CM | POA: Diagnosis not present

## 2020-06-12 DIAGNOSIS — O09523 Supervision of elderly multigravida, third trimester: Secondary | ICD-10-CM | POA: Diagnosis not present

## 2020-06-26 DIAGNOSIS — Z3A33 33 weeks gestation of pregnancy: Secondary | ICD-10-CM | POA: Diagnosis not present

## 2020-06-26 DIAGNOSIS — Z3483 Encounter for supervision of other normal pregnancy, third trimester: Secondary | ICD-10-CM | POA: Diagnosis not present

## 2020-06-26 DIAGNOSIS — O09523 Supervision of elderly multigravida, third trimester: Secondary | ICD-10-CM | POA: Diagnosis not present

## 2020-07-11 NOTE — L&D Delivery Note (Addendum)
Delivery Note:   G3P1011 at [redacted]w[redacted]d  Admitting diagnosis: Normal labor and delivery [O80] Risks: AMA, PEC on magnesium sulfate Onset of labor: 1/12 @ 2300 IOL/Augmentation: AROM, Pitocin and Cytotec ROM: 1/13 @ 1007, clear fluid  Complete dilation at 07/23/2020  1130 Onset of pushing at 1130 FHR second stage Cat I  Analgesia /Anesthesia intrapartum:Epidural  Pushing in side lying position with CNM and L&D staff support, Minerva Areola, husband, and doula, Trula Ore, present for birth and supportive.  Delivery of a Live born female  Birth Weight: 3420g, 7lb 8.6oz  APGAR: 7, 9  Newborn Delivery   Birth date/time: 07/23/2020 11:39:00 Delivery type: Vaginal, Spontaneous      in cephalic presentation, position OA to LOA.  APGAR:1 min-7 , 5 min-9   Nuchal Cord: No  Cord double clamped after cessation of pulsation, cut by Minerva Areola.  Collection of cord blood for typing completed. Cord blood donation-None  Arterial cord blood sample-No    Placenta delivered-Spontaneous;Expressed  with 3 vessels . Uterotonics: Pitocin Placenta to L&D Uterine tone firm  Bleeding stable, small trickle after placenta delivery, small clots removed from lower uterine segment and bleeding slowed   2nd degree  laceration identified.  Episiotomy:None  Local analgesia: N/A  Repair: 2-0 Vicryl in usual fashion with excellent hemostasis Est. Blood Loss (mL):250.00   Complications: None  Mom to postpartum. Jodi Marble to Couplet care / Skin to Skin.  Contnue Magnesium sulfate prophylaxis x 24hrs.  Rpt labs in am. Monitor BP closely.  Review the Delivery Report for details.    June Leap, CNM, MSN 07/23/2020, 12:11 PM

## 2020-07-13 DIAGNOSIS — Z3483 Encounter for supervision of other normal pregnancy, third trimester: Secondary | ICD-10-CM | POA: Diagnosis not present

## 2020-07-13 DIAGNOSIS — Z3A36 36 weeks gestation of pregnancy: Secondary | ICD-10-CM | POA: Diagnosis not present

## 2020-07-13 DIAGNOSIS — O09523 Supervision of elderly multigravida, third trimester: Secondary | ICD-10-CM | POA: Diagnosis not present

## 2020-07-13 DIAGNOSIS — Z3685 Encounter for antenatal screening for Streptococcus B: Secondary | ICD-10-CM | POA: Diagnosis not present

## 2020-07-21 DIAGNOSIS — O133 Gestational [pregnancy-induced] hypertension without significant proteinuria, third trimester: Secondary | ICD-10-CM | POA: Diagnosis not present

## 2020-07-21 DIAGNOSIS — O09523 Supervision of elderly multigravida, third trimester: Secondary | ICD-10-CM | POA: Diagnosis not present

## 2020-07-21 DIAGNOSIS — Z3A37 37 weeks gestation of pregnancy: Secondary | ICD-10-CM | POA: Diagnosis not present

## 2020-07-21 DIAGNOSIS — Z3483 Encounter for supervision of other normal pregnancy, third trimester: Secondary | ICD-10-CM | POA: Diagnosis not present

## 2020-07-22 ENCOUNTER — Inpatient Hospital Stay (HOSPITAL_COMMUNITY)
Admission: AD | Admit: 2020-07-22 | Discharge: 2020-07-27 | DRG: 807 | Disposition: A | Discharge status: Home / Self Care | Payer: BC Managed Care – PPO | Attending: Obstetrics and Gynecology | Admitting: Obstetrics and Gynecology

## 2020-07-22 ENCOUNTER — Other Ambulatory Visit: Payer: Self-pay

## 2020-07-22 ENCOUNTER — Encounter (HOSPITAL_COMMUNITY): Payer: Self-pay | Admitting: Obstetrics and Gynecology

## 2020-07-22 DIAGNOSIS — O1494 Unspecified pre-eclampsia, complicating childbirth: Secondary | ICD-10-CM | POA: Diagnosis not present

## 2020-07-22 DIAGNOSIS — F419 Anxiety disorder, unspecified: Secondary | ICD-10-CM | POA: Diagnosis present

## 2020-07-22 DIAGNOSIS — R030 Elevated blood-pressure reading, without diagnosis of hypertension: Secondary | ICD-10-CM | POA: Diagnosis present

## 2020-07-22 DIAGNOSIS — Z3A37 37 weeks gestation of pregnancy: Secondary | ICD-10-CM

## 2020-07-22 DIAGNOSIS — O134 Gestational [pregnancy-induced] hypertension without significant proteinuria, complicating childbirth: Secondary | ICD-10-CM | POA: Diagnosis not present

## 2020-07-22 DIAGNOSIS — O99344 Other mental disorders complicating childbirth: Secondary | ICD-10-CM | POA: Diagnosis present

## 2020-07-22 DIAGNOSIS — Z20822 Contact with and (suspected) exposure to covid-19: Secondary | ICD-10-CM | POA: Diagnosis present

## 2020-07-22 DIAGNOSIS — F32A Depression, unspecified: Secondary | ICD-10-CM | POA: Diagnosis present

## 2020-07-22 DIAGNOSIS — Z87891 Personal history of nicotine dependence: Secondary | ICD-10-CM

## 2020-07-22 DIAGNOSIS — O1493 Unspecified pre-eclampsia, third trimester: Secondary | ICD-10-CM | POA: Diagnosis present

## 2020-07-22 DIAGNOSIS — Z412 Encounter for routine and ritual male circumcision: Secondary | ICD-10-CM | POA: Diagnosis not present

## 2020-07-22 LAB — CBC
HCT: 42.1 % (ref 36.0–46.0)
Hemoglobin: 14.1 g/dL (ref 12.0–15.0)
MCH: 30.7 pg (ref 26.0–34.0)
MCHC: 33.5 g/dL (ref 30.0–36.0)
MCV: 91.5 fL (ref 80.0–100.0)
Platelets: 225 10*3/uL (ref 150–400)
RBC: 4.6 MIL/uL (ref 3.87–5.11)
RDW: 13.3 % (ref 11.5–15.5)
WBC: 13.8 10*3/uL — ABNORMAL HIGH (ref 4.0–10.5)
nRBC: 0 % (ref 0.0–0.2)

## 2020-07-22 LAB — TYPE AND SCREEN
ABO/RH(D): O POS
Antibody Screen: NEGATIVE

## 2020-07-22 LAB — URINALYSIS, ROUTINE W REFLEX MICROSCOPIC
Bilirubin Urine: NEGATIVE
Glucose, UA: NEGATIVE mg/dL
Hgb urine dipstick: NEGATIVE
Ketones, ur: 5 mg/dL — AB
Leukocytes,Ua: NEGATIVE
Nitrite: NEGATIVE
Protein, ur: NEGATIVE mg/dL
Specific Gravity, Urine: 1.003 — ABNORMAL LOW (ref 1.005–1.030)
pH: 7 (ref 5.0–8.0)

## 2020-07-22 LAB — PROTEIN / CREATININE RATIO, URINE
Creatinine, Urine: 18.37 mg/dL
Protein Creatinine Ratio: 0.65 mg/mg{Cre} — ABNORMAL HIGH (ref 0.00–0.15)
Total Protein, Urine: 12 mg/dL

## 2020-07-22 LAB — RESP PANEL BY RT-PCR (FLU A&B, COVID) ARPGX2
Influenza A by PCR: NEGATIVE
Influenza B by PCR: NEGATIVE
SARS Coronavirus 2 by RT PCR: NEGATIVE

## 2020-07-22 LAB — COMPREHENSIVE METABOLIC PANEL
ALT: 29 U/L (ref 0–44)
AST: 33 U/L (ref 15–41)
Albumin: 3.2 g/dL — ABNORMAL LOW (ref 3.5–5.0)
Alkaline Phosphatase: 101 U/L (ref 38–126)
Anion gap: 11 (ref 5–15)
BUN: 6 mg/dL (ref 6–20)
CO2: 22 mmol/L (ref 22–32)
Calcium: 9.4 mg/dL (ref 8.9–10.3)
Chloride: 104 mmol/L (ref 98–111)
Creatinine, Ser: 0.66 mg/dL (ref 0.44–1.00)
GFR, Estimated: >60 mL/min (ref >60)
Glucose, Bld: 87 mg/dL (ref 70–99)
Potassium: 4.2 mmol/L (ref 3.5–5.1)
Sodium: 137 mmol/L (ref 135–145)
Total Bilirubin: 0.9 mg/dL (ref 0.3–1.2)
Total Protein: 6.7 g/dL (ref 6.5–8.1)

## 2020-07-22 MED ORDER — OXYCODONE-ACETAMINOPHEN 5-325 MG PO TABS: 1.0000 | ORAL_TABLET | ORAL | Status: DC | PRN

## 2020-07-22 MED ORDER — FLEET ENEMA 7-19 GM/118ML RE ENEM: 1.0000 | ENEMA | RECTAL | Status: DC | PRN

## 2020-07-22 MED ORDER — MAGNESIUM SULFATE 40 GM/1000ML IV SOLN
2.0000 g/h | INTRAVENOUS | Status: AC
  Administered 2020-07-22 – 2020-07-23 (×2): 2 g/h via INTRAVENOUS
  Filled 2020-07-22 (×2): qty 1000

## 2020-07-22 MED ORDER — LABETALOL HCL 5 MG/ML IV SOLN: 80.0000 mg | INTRAVENOUS | Status: DC | PRN

## 2020-07-22 MED ORDER — SOD CITRATE-CITRIC ACID 500-334 MG/5ML PO SOLN: 30.0000 mL | ORAL | Status: DC | PRN

## 2020-07-22 MED ORDER — LACTATED RINGERS IV SOLN: INTRAVENOUS | Status: DC

## 2020-07-22 MED ORDER — HYDRALAZINE HCL 20 MG/ML IJ SOLN: 10.0000 mg | INTRAMUSCULAR | Status: DC | PRN

## 2020-07-22 MED ORDER — MISOPROSTOL 25 MCG QUARTER TABLET
25.0000 ug | ORAL_TABLET | Freq: Four times a day (QID) | ORAL | Status: DC | PRN
  Administered 2020-07-22 – 2020-07-23 (×2): 25 ug via VAGINAL
  Filled 2020-07-22 (×2): qty 1

## 2020-07-22 MED ORDER — BETAMETHASONE SOD PHOS & ACET 6 (3-3) MG/ML IJ SUSP
12.0000 mg | Freq: Once | INTRAMUSCULAR | Status: AC
  Administered 2020-07-22: 12 mg via INTRAMUSCULAR
  Filled 2020-07-22: qty 5

## 2020-07-22 MED ORDER — LABETALOL HCL 5 MG/ML IV SOLN
20.0000 mg | INTRAVENOUS | Status: DC | PRN
  Administered 2020-07-22: 20 mg via INTRAVENOUS
  Filled 2020-07-22: qty 4

## 2020-07-22 MED ORDER — ACETAMINOPHEN 325 MG PO TABS
650.0000 mg | ORAL_TABLET | ORAL | Status: DC | PRN
  Administered 2020-07-23: 650 mg via ORAL
  Filled 2020-07-22: qty 2

## 2020-07-22 MED ORDER — HYDROXYZINE HCL 50 MG PO TABS
25.0000 mg | ORAL_TABLET | Freq: Four times a day (QID) | ORAL | Status: DC | PRN
  Administered 2020-07-22: 25 mg via ORAL
  Filled 2020-07-22: qty 1

## 2020-07-22 MED ORDER — OXYCODONE-ACETAMINOPHEN 5-325 MG PO TABS: 2.0000 | ORAL_TABLET | ORAL | Status: DC | PRN

## 2020-07-22 MED ORDER — OXYTOCIN BOLUS FROM INFUSION
333.0000 mL | Freq: Once | INTRAVENOUS | Status: AC
  Administered 2020-07-23: 333 mL via INTRAVENOUS

## 2020-07-22 MED ORDER — OXYTOCIN-SODIUM CHLORIDE 30-0.9 UT/500ML-% IV SOLN
2.5000 [IU]/h | INTRAVENOUS | Status: DC
  Filled 2020-07-22: qty 500

## 2020-07-22 MED ORDER — LABETALOL HCL 5 MG/ML IV SOLN: 40.0000 mg | INTRAVENOUS | Status: DC | PRN

## 2020-07-22 MED ORDER — TERBUTALINE SULFATE 1 MG/ML IJ SOLN: 0.2500 mg | Freq: Once | INTRAMUSCULAR | Status: DC | PRN

## 2020-07-22 MED ORDER — ONDANSETRON HCL 4 MG/2ML IJ SOLN: 4.0000 mg | Freq: Four times a day (QID) | INTRAMUSCULAR | Status: DC | PRN

## 2020-07-22 MED ORDER — MAGNESIUM SULFATE BOLUS VIA INFUSION
4.0000 g | Freq: Once | INTRAVENOUS | Status: AC
  Administered 2020-07-22: 4 g via INTRAVENOUS
  Filled 2020-07-22: qty 1000

## 2020-07-22 MED ORDER — HYDROXYZINE HCL 25 MG PO TABS
25.0000 mg | ORAL_TABLET | Freq: Once | ORAL | Status: AC
  Administered 2020-07-22: 25 mg via ORAL
  Filled 2020-07-22: qty 1

## 2020-07-22 MED ORDER — LIDOCAINE HCL (PF) 1 % IJ SOLN: 30.0000 mL | INTRAMUSCULAR | Status: DC | PRN

## 2020-07-22 MED ORDER — LACTATED RINGERS IV SOLN
500.0000 mL | INTRAVENOUS | Status: DC | PRN
Start: 2020-07-22 — End: 2020-07-23

## 2020-07-22 NOTE — MAU Note (Signed)
Dr Billy Coast called and asked to put in admit orders for Anna Harrell admission. STates he will put them in when [facility] calls him. RN told him we could not move the patient until we have admit orders. STates he will put them in at 2100

## 2020-07-22 NOTE — MAU Provider Note (Addendum)
Chief Complaint:  BP Evaluation   Event Date/Time   First Provider Initiated Contact with Patient 07/22/20 1640     HPI: Anna Harrell is a 45 y.o. G3P1011 at [redacted]w[redacted]d who was sent to maternity admissions from her OB visit after having two elevated blood pressures at her appointment this afternoon. She states her first blood pressure was taken at 1440. She denies any headache, epigastric pain, flank pain, or visual disturbances. She states she was induced with her last pregnancy for "blood pressure issues". She also has severe anxiety, which has caused increased pulse/BP in the past. She has responded well to vistaril. Denies vaginal bleeding, leaking of fluid, decreased fetal movement, fever, falls, or recent illness.   Past Medical History:  Diagnosis Date  . Anxiety   . Depression   . Hypertension    preeclampsia, took BBs for a few years for anxiety but went off around 2015   OB History  Gravida Para Term Preterm AB Living  $Remov'3 1 1   1 1  'SCxbEc$ SAB IAB Ectopic Multiple Live Births        0 1    # Outcome Date GA Lbr Len/2nd Weight Sex Delivery Anes PTL Lv  3 Current           2 Term 01/22/18 [redacted]w[redacted]d 01:28 / 01:51 6 lb 12.5 oz (3.076 kg) F Vag-Spont EPI  LIV  1 AB 02/2017 [redacted]w[redacted]d    SAB      History reviewed. No pertinent surgical history. History reviewed. No pertinent family history. Social History   Tobacco Use  . Smoking status: Former Smoker    Quit date: 2001    Years since quitting: 21.0  . Smokeless tobacco: Never Used  Vaping Use  . Vaping Use: Never used  Substance Use Topics  . Alcohol use: Not Currently    Comment: not while pregnant  . Drug use: Never   No Known Allergies Medications Prior to Admission  Medication Sig Dispense Refill Last Dose  . aspirin EC 81 MG tablet Take 81 mg by mouth daily. Swallow whole.   07/22/2020 at Unknown time  . Prenatal Vit-Fe Fumarate-FA (MULTIVITAMIN-PRENATAL) 27-0.8 MG TABS tablet Take 1 tablet by mouth daily at 12 noon.   07/22/2020  at Unknown time  . acetaminophen (TYLENOL) 325 MG tablet Take 2 tablets (650 mg total) by mouth every 4 (four) hours as needed (for pain scale < 4). 60 tablet 1   . dibucaine (NUPERCAINAL) 1 % OINT Place 1 application rectally as needed for hemorrhoids. 1 Tube 0   . hydrocortisone (ANUSOL-HC) 2.5 % rectal cream Place rectally 4 (four) times daily. 30 g 0   . ibuprofen (ADVIL,MOTRIN) 600 MG tablet Take 1 tablet (600 mg total) by mouth every 6 (six) hours. 30 tablet 0   . witch hazel-glycerin (TUCKS) pad Apply 1 application topically as needed for hemorrhoids. 33 each 12    I have reviewed patient's Past Medical Hx, Surgical Hx, Family Hx, Social Hx, medications and allergies.   ROS:  Review of Systems  HENT: Negative for congestion and sore throat.   Respiratory: Positive for chest tightness (when anxiety is high). Negative for shortness of breath.   Gastrointestinal: Negative for nausea and vomiting.  Neurological: Negative for dizziness, light-headedness and headaches.  Psychiatric/Behavioral: The patient is nervous/anxious.   All other systems reviewed and are negative.  Physical Exam   Patient Vitals for the past 24 hrs:  BP Temp Temp src Pulse Resp SpO2 Height Weight  07/22/20 2046 (!) 128/96 -- -- 98 -- -- -- --  07/22/20 2030 (!) 148/87 -- -- (!) 104 -- 98 % -- --  07/22/20 2015 129/88 -- -- 96 -- 98 % -- --  07/22/20 2000 124/86 -- -- 94 -- 98 % -- --  07/22/20 1945 117/84 -- -- 98 -- 98 % -- --  07/22/20 1930 122/85 -- -- (!) 102 -- 97 % -- --  07/22/20 1915 124/90 -- -- 97 -- 98 % -- --  07/22/20 1900 (!) 126/94 -- -- 99 -- 98 % -- --  07/22/20 1830 (!) 133/96 -- -- 96 -- 99 % -- --  07/22/20 1815 (!) 136/95 -- -- 97 -- 99 % -- --  07/22/20 1750 (!) 172/96 -- -- (!) 118 -- 99 % -- --  07/22/20 1746 (!) 160/142 -- -- (!) 101 -- -- -- --  07/22/20 1731 (!) 163/93 -- -- (!) 113 -- -- -- --  07/22/20 1716 (!) 147/102 -- -- (!) 122 -- -- -- --  07/22/20 1701 (!) 145/103 -- --  (!) 106 -- -- -- --  07/22/20 1646 (!) 162/93 -- -- 99 -- -- -- --  07/22/20 1631 (!) 150/100 -- -- 91 -- -- -- --  07/22/20 1615 (!) 159/102 98.3 F (36.8 C) Oral (!) 116 18 100 % -- --  07/22/20 1608 -- -- -- -- -- -- $Rem'5\' 6"'VmQn$  (1.676 m) 165 lb 11.2 oz (75.2 kg)   Constitutional: Well-developed, well-nourished female in no acute physical distress.  Cardiovascular: normal rate & rhythm, no murmur Respiratory: normal effort, lung sounds clear throughout GI: Abd soft, non-tender, gravid appropriate for gestational age. Pos BS x 4 MS: Extremities nontender, no edema, normal ROM Neurologic: Alert and oriented x 4.  GU: no CVA tenderness Pelvic exam deferred  Fetal Tracing: reactive Baseline: 140 Variability: moderate Accelerations: present Decelerations: none Toco: relaxed   Labs: Results for orders placed or performed during the hospital encounter of 07/22/20 (from the past 24 hour(s))  Urinalysis, Routine w reflex microscopic Urine, Clean Catch     Status: Abnormal   Collection Time: 07/22/20  4:22 PM  Result Value Ref Range   Color, Urine STRAW (A) YELLOW   APPearance CLEAR CLEAR   Specific Gravity, Urine 1.003 (L) 1.005 - 1.030   pH 7.0 5.0 - 8.0   Glucose, UA NEGATIVE NEGATIVE mg/dL   Hgb urine dipstick NEGATIVE NEGATIVE   Bilirubin Urine NEGATIVE NEGATIVE   Ketones, ur 5 (A) NEGATIVE mg/dL   Protein, ur NEGATIVE NEGATIVE mg/dL   Nitrite NEGATIVE NEGATIVE   Leukocytes,Ua NEGATIVE NEGATIVE  Protein / creatinine ratio, urine     Status: Abnormal   Collection Time: 07/22/20  4:41 PM  Result Value Ref Range   Creatinine, Urine 18.37 mg/dL   Total Protein, Urine 12 mg/dL   Protein Creatinine Ratio 0.65 (H) 0.00 - 0.15 mg/mg[Cre]  CBC     Status: Abnormal   Collection Time: 07/22/20  4:53 PM  Result Value Ref Range   WBC 13.8 (H) 4.0 - 10.5 K/uL   RBC 4.60 3.87 - 5.11 MIL/uL   Hemoglobin 14.1 12.0 - 15.0 g/dL   HCT 42.1 36.0 - 46.0 %   MCV 91.5 80.0 - 100.0 fL   MCH 30.7  26.0 - 34.0 pg   MCHC 33.5 30.0 - 36.0 g/dL   RDW 13.3 11.5 - 15.5 %   Platelets 225 150 - 400 K/uL   nRBC 0.0 0.0 - 0.2 %  Comprehensive metabolic panel     Status: Abnormal   Collection Time: 07/22/20  4:53 PM  Result Value Ref Range   Sodium 137 135 - 145 mmol/L   Potassium 4.2 3.5 - 5.1 mmol/L   Chloride 104 98 - 111 mmol/L   CO2 22 22 - 32 mmol/L   Glucose, Bld 87 70 - 99 mg/dL   BUN 6 6 - 20 mg/dL   Creatinine, Ser 0.66 0.44 - 1.00 mg/dL   Calcium 9.4 8.9 - 10.3 mg/dL   Total Protein 6.7 6.5 - 8.1 g/dL   Albumin 3.2 (L) 3.5 - 5.0 g/dL   AST 33 15 - 41 U/L   ALT 29 0 - 44 U/L   Alkaline Phosphatase 101 38 - 126 U/L   Total Bilirubin 0.9 0.3 - 1.2 mg/dL   GFR, Estimated >60 >60 mL/min   Anion gap 11 5 - 15   Imaging:  No results found.  MAU Course: Orders Placed This Encounter  Procedures  . Resp Panel by RT-PCR (Flu A&B, Covid) Nasopharyngeal Swab  . Urinalysis, Routine w reflex microscopic Urine, Clean Catch  . CBC  . Comprehensive metabolic panel  . Protein / creatinine ratio, urine  . Notify Physician  . Measure blood pressure  . Airborne and Contact precautions   Meds ordered this encounter  Medications  . hydrOXYzine (ATARAX/VISTARIL) tablet 25 mg  . AND Linked Order Group   . labetalol (NORMODYNE) injection 20 mg   . labetalol (NORMODYNE) injection 40 mg   . labetalol (NORMODYNE) injection 80 mg   . hydrALAZINE (APRESOLINE) injection 10 mg  . betamethasone acetate-betamethasone sodium phosphate (CELESTONE) injection 12 mg    MDM: Vistaril ordered, pt reported decreased anxiousness Preeclampsia labs: CBC/CMP normal, P:Cr is elevated (0.65)  Four severe range pressures, labetalol protocol ordered  Pt needed one dose of labetalol given after vistaril, pressures decreased to normal range from 1915-2015  With increased pressures 4hrs apart between her office visit and MAU visit + increased P:Cr, pt meets criteria for preeclampsia with severe range  pressures. Consulted Dr. Nehemiah Settle who recommended admission for IOL for PEC w/ SF. [facility] notified and informed of criteria being met. Dr. Ronita Hipps originally requested pt be given BMZ and sent to [facility] for magnesium titration overnight.  RNs to call for verbal admission orders.  When RNs called for orders, Dr. Ronita Hipps changed plan from [facility] to L&D as pressures were increased in the office yesterday as well.   Care turned over to Dr. Ronita Hipps at 2030.  Gaylan Gerold, CNM, MSN, [facility] 07/22/20 9:03 PM

## 2020-07-22 NOTE — MAU Note (Signed)
Sent from MD office for BP evaluation.  Denies H/A, visual disturbances, and epigastric pain.  Pt reports thinks BP elevated d/t increased anxiety as due date approaches and possibility to Covid exposure.  Hx Pre E with previous pregnancy

## 2020-07-22 NOTE — H&P (Addendum)
Anna Harrell is a 45 y.o. female presenting for IOL for [place] BP in office yesterday and today.[place] provider noted only Inc BP today with inc anxiety. BPs improved after vistaril Review of prenatal care, however, notes inc BP yesterday as well thus meeting criteria for gest htn.  Criteria for gest htn ( as discussed with MAU provider) were not met until chart reviewed as noted above.PC ratio inc today. Original plan to manage expectantly but now plan IOL given criteria.Pt understands plan and agrees to IOL. OB History    Gravida  3   Para  1   Term  1   Preterm      AB  1   Living  1     SAB      IAB      Ectopic      Multiple  0   Live Births  1          Past Medical History:  Diagnosis Date  . Anxiety   . Depression   . Hypertension    preeclampsia, took BBs for a few years for anxiety but went off around 2015   History reviewed. No pertinent surgical history. Family History: family history is not on file. Social History:  reports that she quit smoking about 21 years ago. She has never used smokeless tobacco. She reports previous alcohol use. She reports that she does not use drugs.     Maternal Diabetes: No Genetic Screening: Normal Maternal Ultrasounds/Referrals: Normal Fetal Ultrasounds or other Referrals:  None Maternal Substance Abuse:  No Significant Maternal Medications:  None Significant Maternal Lab Results:  Group B Strep negative Other Comments:  None  Review of Systems  Constitutional: Negative.   All other systems reviewed and are negative.  Maternal Medical History:  Contractions: Onset was less than 1 hour ago.   Frequency: rare.   Perceived severity is mild.    Fetal activity: Perceived fetal activity is normal.    Prenatal complications: PIH.   Prenatal Complications - Diabetes: none.      Blood pressure (!) 129/93, pulse 98, temperature 98.4 F (36.9 C), temperature source Oral, resp. rate 18, height _0  (1.676 m),  weight 75.2 kg, SpO2 98 %, unknown if currently breastfeeding. Maternal Exam:  Uterine Assessment: Contraction strength is mild.  Contraction frequency is rare.   Abdomen: Patient reports no abdominal tenderness. Fetal presentation: vertex  Introitus: Normal vulva. Normal vagina.  Ferning test: not done.  Nitrazine test: not done. Amniotic fluid character: not assessed.  Pelvis: adequate for delivery.   Cervix: Cervix evaluated by digital exam.     Physical Exam Vitals and nursing note reviewed.  Constitutional:      Appearance: Normal appearance.  HENT:     Head: Normocephalic and atraumatic.  Cardiovascular:     Rate and Rhythm: Normal rate and regular rhythm.     Pulses: Normal pulses.     Heart sounds: Normal heart sounds.  Pulmonary:     Effort: Pulmonary effort is normal.     Breath sounds: Normal breath sounds.  Abdominal:     Palpations: Abdomen is soft.  Genitourinary:    General: Normal vulva.  Musculoskeletal:        General: Normal range of motion.     Cervical back: Normal range of motion and neck supple.  Skin:    General: Skin is warm and dry.  Neurological:     General: No focal deficit present.     Mental Status:  She is alert.  Psychiatric:        Mood and Affect: Mood normal.        Behavior: Behavior normal.     Prenatal labs: ABO, Rh:  pos Antibody:  neg Rubella:  imm RPR:   neg HBsAg:   neg HIV:   neg GBS:   neg  Assessment/Plan: 37 + week IUP New onset PEC- occ severe range BP Admit MagSo4 Cytotec   Tyquavious Gamel J 07/22/2020, 9:56 PM

## 2020-07-23 ENCOUNTER — Inpatient Hospital Stay (HOSPITAL_COMMUNITY): Payer: BC Managed Care – PPO | Admitting: Anesthesiology

## 2020-07-23 DIAGNOSIS — F32A Depression, unspecified: Secondary | ICD-10-CM | POA: Diagnosis present

## 2020-07-23 DIAGNOSIS — F419 Anxiety disorder, unspecified: Secondary | ICD-10-CM | POA: Diagnosis present

## 2020-07-23 LAB — CBC
HCT: 38.4 % (ref 36.0–46.0)
HCT: 38.2 % (ref 36.0–46.0)
Hemoglobin: 12.7 g/dL (ref 12.0–15.0)
Hemoglobin: 13 g/dL (ref 12.0–15.0)
MCH: 30.4 pg (ref 26.0–34.0)
MCH: 31 pg (ref 26.0–34.0)
MCHC: 33.1 g/dL (ref 30.0–36.0)
MCHC: 34 g/dL (ref 30.0–36.0)
MCV: 91.9 fL (ref 80.0–100.0)
MCV: 91.2 fL (ref 80.0–100.0)
Platelets: 208 10*3/uL (ref 150–400)
Platelets: 232 10*3/uL (ref 150–400)
RBC: 4.18 MIL/uL (ref 3.87–5.11)
RBC: 4.19 MIL/uL (ref 3.87–5.11)
RDW: 13.3 % (ref 11.5–15.5)
RDW: 13.3 % (ref 11.5–15.5)
WBC: 12.3 10*3/uL — ABNORMAL HIGH (ref 4.0–10.5)
WBC: 15.3 10*3/uL — ABNORMAL HIGH (ref 4.0–10.5)
nRBC: 0 % (ref 0.0–0.2)
nRBC: 0 % (ref 0.0–0.2)

## 2020-07-23 LAB — COMPREHENSIVE METABOLIC PANEL
ALT: 33 U/L (ref 0–44)
AST: 37 U/L (ref 15–41)
Albumin: 2.9 g/dL — ABNORMAL LOW (ref 3.5–5.0)
Alkaline Phosphatase: 96 U/L (ref 38–126)
Anion gap: 11 (ref 5–15)
BUN: 5 mg/dL — ABNORMAL LOW (ref 6–20)
CO2: 20 mmol/L — ABNORMAL LOW (ref 22–32)
Calcium: 7.8 mg/dL — ABNORMAL LOW (ref 8.9–10.3)
Chloride: 103 mmol/L (ref 98–111)
Creatinine, Ser: 0.64 mg/dL (ref 0.44–1.00)
GFR, Estimated: >60 mL/min (ref >60)
Glucose, Bld: 146 mg/dL — ABNORMAL HIGH (ref 70–99)
Potassium: 3.9 mmol/L (ref 3.5–5.1)
Sodium: 134 mmol/L — ABNORMAL LOW (ref 135–145)
Total Bilirubin: 0.3 mg/dL (ref 0.3–1.2)
Total Protein: 6.2 g/dL — ABNORMAL LOW (ref 6.5–8.1)

## 2020-07-23 LAB — RPR: RPR Ser Ql: NONREACTIVE

## 2020-07-23 MED ORDER — LACTATED RINGERS IV SOLN: INTRAVENOUS | Status: DC

## 2020-07-23 MED ORDER — OXYTOCIN-SODIUM CHLORIDE 30-0.9 UT/500ML-% IV SOLN
1.0000 m[IU]/min | INTRAVENOUS | Status: DC
  Administered 2020-07-23: 2 m[IU]/min via INTRAVENOUS

## 2020-07-23 MED ORDER — DIBUCAINE (PERIANAL) 1 % EX OINT: 1.0000 "application " | TOPICAL_OINTMENT | CUTANEOUS | Status: DC | PRN

## 2020-07-23 MED ORDER — LABETALOL HCL 5 MG/ML IV SOLN: 80.0000 mg | INTRAVENOUS | Status: DC | PRN

## 2020-07-23 MED ORDER — FENTANYL-BUPIVACAINE-NACL 0.5-0.125-0.9 MG/250ML-% EP SOLN
EPIDURAL | Status: AC
  Filled 2020-07-23: qty 250

## 2020-07-23 MED ORDER — LACTATED RINGERS IV SOLN: 500.0000 mL | Freq: Once | INTRAVENOUS | Status: DC

## 2020-07-23 MED ORDER — ONDANSETRON HCL 4 MG PO TABS: 4.0000 mg | ORAL_TABLET | ORAL | Status: DC | PRN

## 2020-07-23 MED ORDER — PHENYLEPHRINE 40 MCG/ML (10ML) SYRINGE FOR IV PUSH (FOR BLOOD PRESSURE SUPPORT): 80.0000 ug | PREFILLED_SYRINGE | INTRAVENOUS | Status: DC | PRN

## 2020-07-23 MED ORDER — FENTANYL-BUPIVACAINE-NACL 0.5-0.125-0.9 MG/250ML-% EP SOLN: 12.0000 mL/h | EPIDURAL | Status: DC | PRN

## 2020-07-23 MED ORDER — ONDANSETRON HCL 4 MG/2ML IJ SOLN: 4.0000 mg | INTRAMUSCULAR | Status: DC | PRN

## 2020-07-23 MED ORDER — TETANUS-DIPHTH-ACELL PERTUSSIS 5-2.5-18.5 LF-MCG/0.5 IM SUSY: 0.5000 mL | PREFILLED_SYRINGE | Freq: Once | INTRAMUSCULAR | Status: DC

## 2020-07-23 MED ORDER — DIPHENHYDRAMINE HCL 50 MG/ML IJ SOLN: 12.5000 mg | INTRAMUSCULAR | Status: DC | PRN

## 2020-07-23 MED ORDER — EPHEDRINE 5 MG/ML INJ: 10.0000 mg | INTRAVENOUS | Status: DC | PRN

## 2020-07-23 MED ORDER — SIMETHICONE 80 MG PO CHEW: 80.0000 mg | CHEWABLE_TABLET | ORAL | Status: DC | PRN

## 2020-07-23 MED ORDER — ACETAMINOPHEN 325 MG PO TABS: 650.0000 mg | ORAL_TABLET | ORAL | Status: DC | PRN

## 2020-07-23 MED ORDER — ZOLPIDEM TARTRATE 5 MG PO TABS: 5.0000 mg | ORAL_TABLET | Freq: Every evening | ORAL | Status: DC | PRN

## 2020-07-23 MED ORDER — COCONUT OIL OIL: 1.0000 "application " | TOPICAL_OIL | Status: DC | PRN

## 2020-07-23 MED ORDER — IBUPROFEN 600 MG PO TABS
600.0000 mg | ORAL_TABLET | Freq: Four times a day (QID) | ORAL | Status: DC
  Administered 2020-07-23 – 2020-07-27 (×16): 600 mg via ORAL
  Filled 2020-07-23 (×16): qty 1

## 2020-07-23 MED ORDER — BENZOCAINE-MENTHOL 20-0.5 % EX AERO
1.0000 "application " | INHALATION_SPRAY | CUTANEOUS | Status: DC | PRN
  Administered 2020-07-23: 1 via TOPICAL
  Filled 2020-07-23: qty 56

## 2020-07-23 MED ORDER — LABETALOL HCL 5 MG/ML IV SOLN: 40.0000 mg | INTRAVENOUS | Status: DC | PRN

## 2020-07-23 MED ORDER — SODIUM CHLORIDE (PF) 0.9 % IJ SOLN
INTRAMUSCULAR | Status: DC | PRN
  Administered 2020-07-23: 12 mL/h via EPIDURAL

## 2020-07-23 MED ORDER — WITCH HAZEL-GLYCERIN EX PADS: 1.0000 "application " | MEDICATED_PAD | CUTANEOUS | Status: DC | PRN

## 2020-07-23 MED ORDER — LACTATED RINGERS IV SOLN
500.0000 mL | Freq: Once | INTRAVENOUS | Status: AC
  Administered 2020-07-23: 500 mL via INTRAVENOUS

## 2020-07-23 MED ORDER — DIPHENHYDRAMINE HCL 25 MG PO CAPS: 25.0000 mg | ORAL_CAPSULE | Freq: Four times a day (QID) | ORAL | Status: DC | PRN

## 2020-07-23 MED ORDER — PRENATAL MULTIVITAMIN CH
1.0000 | ORAL_TABLET | Freq: Every day | ORAL | Status: DC
  Administered 2020-07-24 – 2020-07-27 (×4): 1 via ORAL
  Filled 2020-07-23 (×4): qty 1

## 2020-07-23 MED ORDER — LABETALOL HCL 5 MG/ML IV SOLN: 20.0000 mg | INTRAVENOUS | Status: DC | PRN

## 2020-07-23 MED ORDER — LIDOCAINE HCL (PF) 1 % IJ SOLN
INTRAMUSCULAR | Status: DC | PRN
  Administered 2020-07-23: 6 mL via EPIDURAL

## 2020-07-23 MED ORDER — SENNOSIDES-DOCUSATE SODIUM 8.6-50 MG PO TABS
2.0000 | ORAL_TABLET | Freq: Every day | ORAL | Status: DC
  Administered 2020-07-24 – 2020-07-27 (×4): 2 via ORAL
  Filled 2020-07-23 (×4): qty 2

## 2020-07-23 MED ORDER — HYDRALAZINE HCL 20 MG/ML IJ SOLN: 10.0000 mg | INTRAMUSCULAR | Status: DC | PRN

## 2020-07-23 NOTE — Lactation Note (Signed)
This note was copied from a baby's chart. Lactation Consultation Note  Patient Name: Anna Harrell [id number] Date: 07/23/2020 Reason for consult: Initial assessment;Early term 37-38.6wks Age:44 hours  Mother desires a lactation consult.  RN to place order.  P2 mother whose infant is now 58 hours old.  This is an ETI at 37+5 weeks.  Mother breast fed her first child (now 76 years old) for 3 months and then pumped and bottle fed for a total of 9 months.    Baby awake and with hiccoughs when I arrived.  Mother attempted to latch approximately one hour ago but he was not interested.  She did latch him after delivery.  Breast feeding basics reviewed while father held baby.  Taught hand expression and mother was able to express colostrum drops which I finger fed back to baby.  Colostrum container provided and milk storage times reviewed.    Offered to assist with latching once babys hiccoughs stopped.  Mother agreeable.  Assisted to latch easily on the left breast in the football hold.  Baby had a wide gape and flanged lips and eagerly began sucking.  Demonstrated breast compressions and gentle stimulation to keep him awake.  Mother excited to see him latching and feeding.  Observed him feeding for 10 minutes prior to leaving the room.    Mother will feed at least every three hours due to gestational age.  She will feed back any EBM she obtains to baby.    Mom made aware of O/P services, breastfeeding support groups, community resources, and our phone # for post-discharge questions. Mother has a DEBP for home use.  Father present.     Maternal Data Formula Feeding for Exclusion: No Has patient been taught Hand Expression?: Yes Does the patient have breastfeeding experience prior to this delivery?: Yes  Feeding Feeding Type: Breast Fed  LATCH Score Latch: Grasps breast easily, tongue down, lips flanged, rhythmical sucking.  Audible Swallowing: A few with stimulation  Type of Nipple:  Everted at rest and after stimulation  Comfort (Breast/Nipple): Soft / non-tender  Hold (Positioning): Assistance needed to correctly position infant at breast and maintain latch.  LATCH Score: 8  Interventions Interventions: Breast feeding basics reviewed;Assisted with latch;Skin to skin;Breast massage;Hand express;Breast compression;Adjust position;Position options;Support pillows  Lactation Tools Discussed/Used     Consult Status Consult Status: Follow-up Date: 07/24/20 Follow-up type: In-patient    Willistine Ferrall R Kailer Heindel 07/23/2020, 7:03 PM

## 2020-07-23 NOTE — Progress Notes (Addendum)
Anna Harrell is a 45 y.o. G3P1011 at [redacted]w[redacted]d by LMP admitted for induction of labor due to Pre-eclamptic toxemia of pregnancy..  Subjective: Crampy. No headaches or visual changes. No epigastric pain  Objective: BP (!) 140/94   Pulse 94   Temp 97.8 F (36.6 C) (Oral)   Resp 16   Ht 5\' 6"  (1.676 m)   Wt 75.2 kg   SpO2 98%   BMI 26.74 kg/m  No intake/output data recorded. Total I/O In: 1644.9 [P.O.:60; I.V.:1584.9] Out: 600 [Urine:600]  FHT:  FHR: 135 bpm, variability: moderate,  accelerations:  Present,  decelerations:  Absent UC:   irregular, every 5 minutes SVE:   3/70/-1 AROM- clear  CMP Latest Ref Rng & Units 07/23/2020 07/22/2020 01/23/2018  Glucose 70 - 99 mg/dL 01/25/2018) 87 86  BUN 6 - 20 mg/dL 5(L) 6 9  Creatinine 536(R - 1.00 mg/dL 4.43 1.54 0.08  Sodium 135 - 145 mmol/L 134(L) 137 132(L)  Potassium 3.5 - 5.1 mmol/L 3.9 4.2 3.8  Chloride 98 - 111 mmol/L 103 104 103  CO2 22 - 32 mmol/L 20(L) 22 20(L)  Calcium 8.9 - 10.3 mg/dL 7.8(L) 9.4 9.0  Total Protein 6.5 - 8.1 g/dL 6.2(L) 6.7 5.9(L)  Total Bilirubin 0.3 - 1.2 mg/dL 0.3 0.9 0.8  Alkaline Phos 38 - 126 U/L 96 101 89  AST 15 - 41 U/L 37 33 50(H)  ALT 0 - 44 U/L 33 29 37     Labs: Lab Results  Component Value Date   WBC 12.3 (H) 07/23/2020   HGB 12.7 07/23/2020   HCT 38.4 07/23/2020   MCV 91.9 07/23/2020   PLT 208 07/23/2020    Assessment / Plan: PEC - severe by presenting BP. Stable labs. Stable BP on mag.  Labor: Progressing normally. Start Pitocin Preeclampsia:  on magnesium sulfate, no signs or symptoms of toxicity, intake and ouput balanced and labs stable Fetal Wellbeing:  Category I Pain Control:  Labor support without medications I/D:  n/a Anticipated MOD:  NSVD  Lauriel Helin J 07/23/2020, 9:56 AM

## 2020-07-23 NOTE — Lactation Note (Signed)
This note was copied from a baby's chart. Lactation Consultation Note  Patient Name: Anna Harrell [id number] Date: 07/23/2020 Reason for consult: L&D Initial assessment Age: 44 hours  L&D consult with 45 minutes old infant and P2 mother. Parents and  Support person present at time of consult. Congratulated them on their newborn. Infant is latched football position to left breast. Noted suckling, swallowing, breast tissue moving and deep latch. Discussed STS as ideal transition for infants after birth helping with temperature, blood sugar and comfort. Talked about primal reflexes such as rooting, hands to mouth, searching for the breast among others.   Explained LC services availability during postpartum stay. Thanked family for their time.    Maternal Data Formula Feeding for Exclusion: No Has patient been taught Hand Expression?: Yes Does the patient have breastfeeding experience prior to this delivery?: Yes  Feeding Feeding Type: Breast Fed  LATCH Score Latch: Grasps breast easily, tongue down, lips flanged, rhythmical sucking.  Audible Swallowing: Spontaneous and intermittent  Type of Nipple: Everted at rest and after stimulation  Comfort (Breast/Nipple): Soft / non-tender  Hold (Positioning): Assistance needed to correctly position infant at breast and maintain latch.  LATCH Score: 9  Interventions Interventions: Breast feeding basics reviewed;Assisted with latch;Skin to skin;Expressed milk  Lactation Tools Discussed/Used     Consult Status Consult Status: Follow-up Date: 07/24/20 Follow-up type: In-patient    Anna Harrell [name] 07/23/2020, 12:37 PM

## 2020-07-23 NOTE — Anesthesia Postprocedure Evaluation (Signed)
Anesthesia Post Note  Patient: Anna Harrell  Procedure(s) Performed: AN AD HOC LABOR EPIDURAL     Patient location during evaluation: Mother Baby Anesthesia Type: Epidural Level of consciousness: awake, awake and alert and oriented Pain management: pain level controlled Vital Signs Assessment: post-procedure vital signs reviewed and stable Respiratory status: spontaneous breathing and respiratory function stable Cardiovascular status: blood pressure returned to baseline Postop Assessment: no headache, epidural receding, patient able to bend at knees, adequate PO intake, no backache, no apparent nausea or vomiting and able to ambulate Anesthetic complications: no   No complications documented.  Last Vitals:  Vitals:   07/23/20 1315 07/23/20 1332  BP: 126/88 (!) 142/95  Pulse: 92 90  Resp: 18 17  Temp:    SpO2:  100%    Last Pain:  Vitals:   07/23/20 1332  TempSrc:   PainSc: 0-No pain   Pain Goal: Patients Stated Pain Goal: 3 (07/23/20 1332)              Epidural/Spinal Function Cutaneous sensation: Able to Wiggle Toes (07/23/20 1332), Patient able to flex knees: Yes (07/23/20 1332), Patient able to lift hips off bed: Yes (07/23/20 1332), Back pain beyond tenderness at insertion site: No (07/23/20 1332), Progressively worsening motor and/or sensory loss: No (07/23/20 1332), Bowel and/or bladder incontinence post epidural: No (07/23/20 1332)  Peyton Bottoms, Bearl Mulberry

## 2020-07-23 NOTE — Anesthesia Procedure Notes (Signed)
Epidural Patient location during procedure: OB Start time: 07/23/2020 10:31 AM End time: 07/23/2020 11:37 AM  Staffing Anesthesiologist: Bethena Midget, MD  Preanesthetic Checklist Completed: patient identified, IV checked, site marked, risks and benefits discussed, surgical consent, monitors and equipment checked, pre-op evaluation and timeout performed  Epidural Patient position: sitting Prep: DuraPrep and site prepped and draped Patient monitoring: continuous pulse ox and blood pressure Approach: midline Location: L3-L4 Injection technique: LOR air  Needle:  Needle type: Tuohy  Needle gauge: 17 G Needle length: 9 cm and 9 Needle insertion depth: 7 cm Catheter type: closed end flexible Catheter size: 19 Gauge Catheter at skin depth: 12 cm Test dose: negative  Assessment Events: blood not aspirated, injection not painful, no injection resistance, no paresthesia and negative IV test

## 2020-07-23 NOTE — Anesthesia Preprocedure Evaluation (Signed)
Anesthesia Evaluation  Patient identified by MRN, date of birth, ID band Patient awake    Reviewed: Allergy & Precautions, H&P , NPO status , Patient's Chart, lab work & pertinent test results, reviewed documented beta blocker date and time   Airway Mallampati: III  TM Distance: >3 FB Neck ROM: full    Dental no notable dental hx. (+) Teeth Intact, Dental Advisory Given   Pulmonary neg pulmonary ROS, former smoker,    Pulmonary exam normal breath sounds clear to auscultation       Cardiovascular hypertension, Pt. on medications negative cardio ROS Normal cardiovascular exam Rhythm:regular Rate:Normal     Neuro/Psych PSYCHIATRIC DISORDERS Anxiety Depression negative neurological ROS     GI/Hepatic negative GI ROS, Neg liver ROS,   Endo/Other  negative endocrine ROS  Renal/GU negative Renal ROS  negative genitourinary   Musculoskeletal   Abdominal   Peds  Hematology negative hematology ROS (+)   Anesthesia Other Findings PIH  Reproductive/Obstetrics (+) Pregnancy                             Anesthesia Physical Anesthesia Plan  ASA: III  Anesthesia Plan: Epidural   Post-op Pain Management:    Induction:   PONV Risk Score and Plan:   Airway Management Planned: Natural Airway  Additional Equipment: None  Intra-op Plan:   Post-operative Plan:   Informed Consent: I have reviewed the patients History and Physical, chart, labs and discussed the procedure including the risks, benefits and alternatives for the proposed anesthesia with the patient or authorized representative who has indicated his/her understanding and acceptance.       Plan Discussed with: Anesthesiologist  Anesthesia Plan Comments:         Anesthesia Quick Evaluation

## 2020-07-23 NOTE — Progress Notes (Signed)
Called by RN for orders En route from hospital and instructed RN , orders would be put in when arrived at destination.

## 2020-07-24 LAB — COMPREHENSIVE METABOLIC PANEL
ALT: 33 U/L (ref 0–44)
AST: 38 U/L (ref 15–41)
Albumin: 2.5 g/dL — ABNORMAL LOW (ref 3.5–5.0)
Alkaline Phosphatase: 78 U/L (ref 38–126)
Anion gap: 9 (ref 5–15)
BUN: 6 mg/dL (ref 6–20)
CO2: 21 mmol/L — ABNORMAL LOW (ref 22–32)
Calcium: 6.5 mg/dL — ABNORMAL LOW (ref 8.9–10.3)
Chloride: 104 mmol/L (ref 98–111)
Creatinine, Ser: 0.65 mg/dL (ref 0.44–1.00)
GFR, Estimated: >60 mL/min (ref >60)
Glucose, Bld: 103 mg/dL — ABNORMAL HIGH (ref 70–99)
Potassium: 3.7 mmol/L (ref 3.5–5.1)
Sodium: 134 mmol/L — ABNORMAL LOW (ref 135–145)
Total Bilirubin: 0.2 mg/dL — ABNORMAL LOW (ref 0.3–1.2)
Total Protein: 5.3 g/dL — ABNORMAL LOW (ref 6.5–8.1)

## 2020-07-24 LAB — CBC
HCT: 33.4 % — ABNORMAL LOW (ref 36.0–46.0)
Hemoglobin: 11.1 g/dL — ABNORMAL LOW (ref 12.0–15.0)
MCH: 30.5 pg (ref 26.0–34.0)
MCHC: 33.2 g/dL (ref 30.0–36.0)
MCV: 91.8 fL (ref 80.0–100.0)
Platelets: 205 10*3/uL (ref 150–400)
RBC: 3.64 MIL/uL — ABNORMAL LOW (ref 3.87–5.11)
RDW: 13.6 % (ref 11.5–15.5)
WBC: 18 10*3/uL — ABNORMAL HIGH (ref 4.0–10.5)
nRBC: 0 % (ref 0.0–0.2)

## 2020-07-24 MED ORDER — NIFEDIPINE ER OSMOTIC RELEASE 30 MG PO TB24
30.0000 mg | ORAL_TABLET | Freq: Every day | ORAL | Status: DC
  Administered 2020-07-24 – 2020-07-27 (×4): 30 mg via ORAL
  Filled 2020-07-24 (×5): qty 1

## 2020-07-24 MED ORDER — NIFEDIPINE ER OSMOTIC RELEASE 30 MG PO TB24: 30.0000 mg | ORAL_TABLET | Freq: Every day | ORAL | Status: DC

## 2020-07-24 MED ORDER — HYDROXYZINE HCL 50 MG PO TABS
25.0000 mg | ORAL_TABLET | Freq: Three times a day (TID) | ORAL | Status: DC | PRN
  Administered 2020-07-24 – 2020-07-26 (×2): 25 mg via ORAL
  Filled 2020-07-24 (×2): qty 1

## 2020-07-24 NOTE — Progress Notes (Addendum)
PPD #1, SVD, 2nd degree repair, s/p IOL for pre-eclampsia, baby boy "Clovis Riley"   S:  Reports feeling okay, very loopy with Magnesium. Reports headache yesterday, but none today. Reports some slight difficulty focusing, but denies scotoma and RUQ/epigastric pain.              Tolerating po/ No nausea or vomiting / Denies dizziness or SOB             Bleeding is light             Pain controlled with Motrin              Up ad lib / ambulatory / voiding QS  Newborn breast feeding going okay, reports some shallow latches at times, but can usually readjust  / Circumcision - planning prior to d/c  O:               VS: BP (!) 146/90 (BP Location: Right Arm)   Pulse 78   Temp 98.3 F (36.8 C) (Oral)   Resp 18   Ht 5\' 6"  (1.676 m)   Wt 75.2 kg   SpO2 98%   Breastfeeding Unknown   BMI 26.74 kg/m   Patient Vitals for the past 24 hrs:  BP Temp Temp src Pulse Resp SpO2  07/24/20 0740 (!) 146/90 98.3 F (36.8 C) Oral 78 18 98 %  07/24/20 0638 -- -- -- -- 18 --  07/24/20 0540 -- -- -- -- 17 --  07/24/20 0327 (!) 138/94 98 F (36.7 C) Oral 81 16 99 %  07/24/20 0222 -- -- -- -- 18 --  07/24/20 0116 -- -- -- -- 17 --  07/23/20 2352 (!) 140/93 97.9 F (36.6 C) Oral 85 17 99 %  07/23/20 2145 (!) 154/105 98.2 F (36.8 C) Oral 82 17 99 %  07/23/20 2019 -- -- -- -- 16 --  07/23/20 1812 139/90 98.1 F (36.7 C) Oral 91 17 100 %  07/23/20 1710 -- -- -- -- 16 --  07/23/20 1600 -- -- -- -- 16 --  07/23/20 1448 (!) 132/91 -- -- 95 18 100 %  07/23/20 1332 (!) 142/95 -- -- 90 17 100 %  07/23/20 1315 126/88 -- -- 92 18 --  07/23/20 1310 137/86 -- -- 98 18 --  07/23/20 1233 (!) 147/81 -- -- 98 18 --  07/23/20 1215 (!) 150/84 -- -- 99 18 --  07/23/20 1205 (!) 141/108 -- -- 95 18 --  07/23/20 1150 (!) 153/89 -- -- 95 18 --  07/23/20 1145 (!) 188/88 -- -- 73 20 --  07/23/20 1130 (!) 168/88 -- -- (!) 126 20 --  07/23/20 1110 (!) 147/86 -- -- 99 16 100 %  07/23/20 1105 (!) 142/81 -- -- 100 16 100 %   07/23/20 1100 124/79 -- -- (!) 103 16 100 %  07/23/20 1055 129/83 -- -- (!) 104 16 100 %  07/23/20 1050 (!) 150/86 -- -- (!) 108 18 100 %  07/23/20 1045 (!) 157/94 -- -- (!) 101 18 100 %  07/23/20 1040 (!) 154/94 -- -- (!) 110 18 100 %  07/23/20 1035 (!) 154/101 -- -- (!) 113 18 100 %  07/23/20 1032 (!) 157/109 -- -- (!) 115 18 --  07/23/20 0930 (!) 140/94 -- -- 94 16 --  07/23/20 0900 (!) 133/93 -- -- 94 16 --  07/23/20 0830 131/85 -- -- 98 16 --     LABS:  Recent Labs    07/23/20 1248 07/24/20 0601  WBC 15.3* 18.0*  HGB 13.0 11.1*  PLT 232 205               Blood type: --/--/O POS (01/12 1654)  Rubella:                       I&O: Intake/Output      01/13 0701 01/14 0700 01/14 0701 01/15 0700   P.O. 1820    I.V. (mL/kg) 4723.5 (62.8)    Total Intake(mL/kg) 6543.5 (87)    Urine (mL/kg/hr) 5347.2 (3)    Blood 250    Total Output 5597.2    Net +946.3         Urine Occurrence 1 x      Flu: ? Covid: UTD Tdap: UTD              Physical Exam:             Alert and oriented X3  Lungs: Clear and unlabored  Heart: regular rate and rhythm / no murmurs  Abdomen: soft, non-tender, non-distended              Fundus: firm, non-tender, below umbilicus   Perineum: well approximated 2nd degree repair, (+) ecchymosis, mild edema, no evidence of hematoma   Lochia: small rubra on pad   Extremities: no edema, no calf pain or tenderness, +2 DTRs, no clonus bilaterally     A/P: PPD # 1, SVD  Pre-eclampsia    - labs stable   - Plan to d/c Magnesium at 12pm today    - Begin Procardia 30mg  XL daily for labile BPs   - Strict I&Os   - Daily weights  Hx. Of Anxiety   -  PT. Requests restarting Vistaril 25mg  TID PRN for anxiety   - plan for close f/u PP   2nd degree repair   Doing well - stable status  Routine post partum orders  Lactation support PRN  Continue current care, circ prior to d/c  Possible d/c home tomorrow   POC in consult with Dr. , MSN, CNM [facility]

## 2020-07-24 NOTE — Lactation Note (Signed)
This note was copied from a baby's chart. Lactation Consultation Note  Patient Name: Boy Laaibah Wartman [id number] Date: 07/24/2020 Reason for consult: Follow-up assessment;Early term 37-38.6wks;Infant weight loss Age:44 hours  P2 mother whose infant is now 80 hours old.  This is an ETI at 37+5 weeks with a 6% weight loss since birth.  Mother breast fed her first child (now 64 years old) for 3 months and then pumped and bottle fed for a total of 9 months.  Baby was latched in the football hold on the left breast when I arrived.  He had been feeding for approximately 10 minutes and looked very sleepy.  He was not sucking effectively. Offered to assist in waking baby and mother agreeable.  After waking him, he latched easily but fell asleep and needed constant stimulation to suck.  Discussed nutritive vs non-nutritive sucking.  Due to the weight loss and gestational age I suggested mother consider supplementation.  Presented the options for the parents and mother willing to provide donor breast milk.  RN notified to obtain consent.  I also suggested that mother begin pumping with the DEBP; mother very willing to do this.  As I was obtaining supplies her breakfast arrived.  Informed her that I will provide time for her to eat and I will return to continue education and iiititate the DEBP.  Mother appreciative.     Maternal Data    Feeding    LATCH Score                   Interventions    Lactation Tools Discussed/Used     Consult Status Consult Status: Follow-up Date: 07/24/20 Follow-up type: In-patient    Dmario Russom R Ady Heimann 07/24/2020, 9:30 AM

## 2020-07-24 NOTE — Social Work (Signed)
CSW received consult for hx of Anxiety.  CSW met with [name] to offer support and complete assessment.    CSW observed FOB feeding baby 'Mitchell' and [name] breastfeeding. CSW offered to return, [name] declined and stated FOB could also remain in room. [name] was extremely pleasant and engaged throughout assessment. CSW informed [name] of reason for consult. [name] expressed understanding and reported she experienced anxiety throughout pregnancy. [name] jokingly stated anxiety is her normal. [name] stated she was first diagnosed in her late 20s. [name] disclosed she was on Zoloft and BB for 6-8 years, which were helpful. [name] was last on medication about 5 years ago. [name] stated she also attended therapy sporadically throughout her lifetime, with 4 years ago being the most recent time of treatment. [name] expressed she is currently doing great and feeling a lot better postpartum. Aside from FOB, [name] identified her neighbors and mother as supports. [name] denies any current SI, or HI.   CSW provided education regarding the baby blues period vs. perinatal mood disorders, discussed treatment and gave resources for mental health follow up if concerns arise.  CSW recommends self-evaluation during the postpartum time period using the New Mom Checklist from Postpartum Progress and encouraged [name] to contact a medical professional if symptoms are noted at any time.   CSW provided review of Sudden Infant Death Syndrome (SIDS) precautions. [name] reported baby will sleep in a bassinet and advance to a crib. [name] stated they have all essential needs for baby, including a car seat. [name] identified [facility] for follow-up care and denies transportation barriers. [name] expressed no additional needs at this time.   CSW identifies no further need for intervention and no barriers to discharge at this time.  Amyri Frenz, MSW, LCSWA Clinical Social Work [facility] [phone] 

## 2020-07-24 NOTE — Lactation Note (Signed)
This note was copied from a baby's chart. Lactation Consultation Note  Patient Name: Anna Harrell [id number] Date: 07/24/2020 Reason for consult: Follow-up assessment Age:45 years  LC Follow Up Visit:  Mother received multiple visits from the hospital care team after breakfast.  After other providers had finished rounding I was able to continue my education and initiate the DEBP.  Reviewed the LPTI policy guidelines with parents.  Discussed supplementation volumes and the feeding plan for today/tonight.  Mother will continue offering the breast whenever baby shows cues or at least every three hours.  Father will supplement with donor breast milk while mother pumps for 15 minutes.  Donor breast milk consent form signed and RN ordered milk specifically for this baby.    Pump parts, assembly, disassembly and cleaning reviewed.  Observed mother pumping for 15 minutes while continuing to educate and answer mother's questions.  #27 flange size is appropriate for mother at this time.  She was able to obtain a few drops of colostrum by the end of the session; drops finger fed back to baby.  Due to initial weight loss and gestational age I reinforced the importance of the supplementation and mother will call for latch assistance as needed.    Mother has a DEBP for home use.  Father supportive.  RN updated.   Maternal Data    Feeding    LATCH Score                   Interventions    Lactation Tools Discussed/Used     Consult Status Consult Status: Follow-up Date: 07/25/20 Follow-up type: In-patient    Maximino Cozzolino R Shavanna Furnari 07/24/2020, 10:36 AM

## 2020-07-25 NOTE — Progress Notes (Signed)
PPD #2 SVD, 2nd degree repair, s/p IOL for pre-eclampsia, non severe range BPs after MAU transfer, baby boy "Clovis Riley"   S:  HA yesterday, none today, swelling in LE down. Feels well. No vision changes/ CP/ SOB             Up ad lib / ambulatory / voiding QS  Newborn breast feeding going okay, s/p circ today O:               VS: BP (!) 144/90 (BP Location: Right Arm) Comment: RN notified  Pulse 77   Temp 98.1 F (36.7 C) (Oral)   Resp 17   Ht 5\' 6"  (1.676 m)   Wt 72.3 kg   SpO2 99%   Breastfeeding Unknown   BMI 25.74 kg/m   Patient Vitals for the past 24 hrs:  BP Temp Temp src Pulse Resp SpO2 Weight  07/25/20 0720 - - - - - 99 % -  07/25/20 0719 (!) 144/90 98.1 F (36.7 C) Oral 77 17 99 % -  07/25/20 0519 - - - - - - 72.3 kg  07/25/20 0407 111/84 98.4 F (36.9 C) Oral 72 18 - -  07/24/20 2329 128/76 - Oral 82 18 99 % -  07/24/20 1920 (!) 135/95 98.7 F (37.1 C) Oral 84 18 - -  07/24/20 1628 139/87 98.3 F (36.8 C) Oral 94 18 99 % -   CBC Latest Ref Rng & Units 07/24/2020 07/23/2020 07/23/2020  WBC 4.0 - 10.5 K/uL 18.0(H) 15.3(H) 12.3(H)  Hemoglobin 12.0 - 15.0 g/dL 11.1(L) 13.0 12.7  Hematocrit 36.0 - 46.0 % 33.4(L) 38.2 38.4  Platelets 150 - 400 K/uL 205 232 208   CMP Latest Ref Rng & Units 07/24/2020 07/23/2020 07/22/2020  Glucose 70 - 99 mg/dL 09/19/2020) 275(T) 87  BUN 6 - 20 mg/dL 6 5(L) 6  Creatinine 700(F - 1.00 mg/dL 7.49 4.49 6.75  Sodium 135 - 145 mmol/L 134(L) 134(L) 137  Potassium 3.5 - 5.1 mmol/L 3.7 3.9 4.2  Chloride 98 - 111 mmol/L 104 103 104  CO2 22 - 32 mmol/L 21(L) 20(L) 22  Calcium 8.9 - 10.3 mg/dL 9.16) 7.8(L) 9.4  Total Protein 6.5 - 8.1 g/dL 5.3(L) 6.2(L) 6.7  Total Bilirubin 0.3 - 1.2 mg/dL 3.8(G) 0.3 0.9  Alkaline Phos 38 - 126 U/L 78 96 101  AST 15 - 41 U/L 38 37 33  ALT 0 - 44 U/L 33 33 29                Blood type: --/--/O POS (01/12 1654)  Rubella:     Immune                              Physical Exam:             Alert and oriented  X3  Lungs: Clear and unlabored  Heart: regular rate and rhythm / no murmurs  Abdomen: soft, non-tender, non-distended              Fundus: firm, non-tender, below umbilicus   Perineum: well approximated 2nd degree repair, (+) ecchymosis, mild edema, no evidence of hematoma   Lochia: small rubra on pad   Extremities: no edema, no calf pain or tenderness, +2 DTRs, no clonus bilaterally     A/P: PPD # 2, SVD  Pre-eclampsia      S/p Magnesium, no neural s.s and BPs stable, on Procardia 30mg  XL daily  Hx. Of Anxiety   -  Restarted  Vistaril 25mg  TID PRN for anxiety   - plan for close f/u PP   2nd degree repair   Doing well - stable status  Routine post partum orders  Lactation support PRN  Discharge later today if baby okay for Dc and BP in <140/90 range  Warning s/s and reportable s/s, pp care, etc reviewed   -V.Annica Marinello MD

## 2020-07-25 NOTE — Lactation Note (Signed)
This note was copied from a baby's chart. Lactation Consultation Note  Patient Name: Anna Harrell [id number] Date: 07/25/2020 Reason for consult: Follow-up assessment Age:43 hours   P2, Baby latched with intermittent swallows.  Mother did a great job Insurance claims handler. Provided pillow for support and encouraged breast compression during feeding.  Reviewed how to flange bottom lip.  Baby is being supplemented with donor milk Reminded mother to continue pumping when able.  Discussed hands on pumping. Infant's discharge pending bilirubin check and circ.   Per mother baby cluster fed last night and her breasts feel slightly heavier today. Mother has 2 DEBPs at home.  Encouraged her to post pump a few times a day until her milk transitions fully and give volume back to baby. Discussed milk storage. Feed on demand with cues.  Goal 8-12+ times per day after first 24 hrs.  Place baby STS if not cueing.  Reviewed engorgement care and monitoring voids/stools.    Maternal Data Formula Feeding for Exclusion: No Has patient been taught Hand Expression?: Yes Does the patient have breastfeeding experience prior to this delivery?: Yes  Feeding Feeding Type: Breast Fed  LATCH Score Latch: Grasps breast easily, tongue down, lips flanged, rhythmical sucking.  Audible Swallowing: A few with stimulation  Type of Nipple: Everted at rest and after stimulation  Comfort (Breast/Nipple): Soft / non-tender  Hold (Positioning): Assistance needed to correctly position infant at breast and maintain latch.  LATCH Score: 8  Interventions Interventions: Breast feeding basics reviewed;Assisted with latch;Skin to skin;Hand express;DEBP;Support pillows  Lactation Tools Discussed/Used     Consult Status Consult Status: Follow-up Date: 07/26/20 Follow-up type: In-patient    Dahlia Byes [facility] 07/25/2020, 9:15 AM

## 2020-07-26 MED ORDER — LABETALOL HCL 200 MG PO TABS
200.0000 mg | ORAL_TABLET | Freq: Three times a day (TID) | ORAL | Status: DC
Start: 2020-07-26 — End: 2020-07-27
  Administered 2020-07-26 – 2020-07-27 (×2): 200 mg via ORAL
  Filled 2020-07-26 (×2): qty 1

## 2020-07-26 MED ORDER — LABETALOL HCL 200 MG PO TABS
200.0000 mg | ORAL_TABLET | Freq: Two times a day (BID) | ORAL | Status: DC
  Administered 2020-07-26: 200 mg via ORAL
  Filled 2020-07-26 (×2): qty 1

## 2020-07-26 NOTE — Progress Notes (Signed)
BPs reviewed.. Labetalol changes to 200mg  TID. Continue Procardia 30mg  XL.

## 2020-07-26 NOTE — Lactation Note (Signed)
This note was copied from a baby's chart. Lactation Consultation Note  Patient Name: Anna Harrell [id number] Date: 07/26/2020 Reason for consult: Follow-up assessment;Early term 37-38.6wks;Hyperbilirubinemia;Nipple pain/trauma Age:44 hours   LC in to visit with P2 Mom and Anna Harrell of ET infant.  Baby's bilirubin level 14.2 this am and MD order single phototherapy.   Baby was at 8.1% weight loss yesterday, at 7% weight loss today. Mom has been offering baby donor breast milk 10-25 ml after breastfeeding since yesterday.  Mom has noted that baby is more sleepy at the breast.  Talked about normal sleepiness with jaundice.  Mom c/o soreness on left nipple with initial latch and pumping.  Mom requested 30 mm flanges as she had used previously.  No visible trauma noted, though nipple is slightly abraded.  Coconut oil to nipple advised and to help with pumping discomfort.  2 bins provided for washing, rinsing and air drying parts.    Plan- 1- Offer breast with cues, goal is to feed baby 8 or more times per 24 hrs. 2- Offer baby 20-30 ml EBM+/donor breast milk increasing prn 3-Pump both breasts 15-30 mins on maintenance setting (Mom expressing 20 ml or more) 4-STS with bilirubin blanket over baby recommended to encourage active breastfeeding.  Mom knows about IP and OP lactation support available to her.  Encouraged to ask for assistance.  Feeding Feeding Type: Breast Fed  LATCH Score Latch: Grasps breast easily, tongue down, lips flanged, rhythmical sucking.  Audible Swallowing: Spontaneous and intermittent  Type of Nipple: Everted at rest and after stimulation  Comfort (Breast/Nipple): Filling, red/small blisters or bruises, mild/mod discomfort  Hold (Positioning): No assistance needed to correctly position infant at breast.  LATCH Score: 9  Interventions Interventions: Breast feeding basics reviewed;Skin to skin;Breast massage;Hand express;Breast compression;Adjust  position;Support pillows;Position options;Expressed milk;Coconut oil;DEBP  Lactation Tools Discussed/Used Tools: Pump;Flanges;Bottle;Coconut oil Flange Size: 30 Breast pump type: Double-Electric Breast Pump   Consult Status Consult Status: Follow-up Date: 07/27/20 Follow-up type: In-patient    [name] 07/26/2020, 8:08 AM

## 2020-07-26 NOTE — Progress Notes (Signed)
PPD #3 Extended admission for BP control  IOL for Preeclampsia, s/p magnesium  SVD, 2nd degree repair, Boy, circ done.    S:  Feels well, no vision changes/ CP/ SOB. Up ad lib / ambulatory / voiding QS  Newborn boy. circ done. Under phototherapy.   O:   VS: BP 122/85 (BP Location: Right Arm)   Pulse 79   Temp 98.3 F (36.8 C) (Oral)   Resp 18   Ht 5\' 6"  (1.676 m)   Wt 71.2 kg   SpO2 99%   Breastfeeding Unknown   BMI 25.34 kg/m   Patient Vitals for the past 24 hrs:  BP Temp Temp src Pulse Resp SpO2 Weight  07/26/20 1134 122/85 98.3 F (36.8 C) Oral 79 18 99 % --  07/26/20 0746 (!) 149/91 (!) 97.5 F (36.4 C) Axillary 76 18 100 % --  07/26/20 0456 -- -- -- -- -- -- 71.2 kg  07/26/20 0450 (!) 144/86 98.2 F (36.8 C) Oral 86 18 100 % --  07/26/20 0008 (!) 146/96 98.1 F (36.7 C) Oral 83 18 99 % --  07/25/20 1939 (!) 139/95 98.3 F (36.8 C) Oral 94 20 99 % --  07/25/20 1708 128/78 98.2 F (36.8 C) Oral 90 17 99 % --               Blood type: --/--/O POS (01/12 1654)  Rubella:     Immune                              Physical Exam:             Alert and oriented X3  Lungs: Clear and unlabored  Heart: regular rate and rhythm / no murmurs  Abdomen: soft, non-tender, non-distended              Fundus: firm, non-tender, below umbilicus   Perineum: no new concerns   Lochia: small  Extremities: no edema, no calf pain or tenderness, +2 DTRs,    A/P: PPD #3 SVD  Pre-eclampsia- delivered, BP management continues, added Labetalol 200mg  po bid to Procardia 30mg  XL daily    Hx. Of Anxiety   -  Restarted  Vistaril 25mg  TID PRN for anxiety   - plan for close f/u PP   2nd degree repair   Doing well - stable status, routine post partum orders, lactation support PRN Discharge tomorrow  Warning s/s and reportable s/s, pp care, etc reviewed   -V.Lenice Koper MD

## 2020-07-26 NOTE — Progress Notes (Signed)
Patient ID: Anna Harrell, female   DOB: 1976-05-31, 45 y.o.   MRN: [id number] BPs reviewed, Procardia 30mg  XL, adding Labetalol 200mg  bid

## 2020-07-27 MED ORDER — COCONUT OIL OIL: 1.0000 "application " | TOPICAL_OIL | 0 refills | Status: AC | PRN

## 2020-07-27 MED ORDER — LABETALOL HCL 200 MG PO TABS: 200.0000 mg | ORAL_TABLET | Freq: Three times a day (TID) | ORAL | 1 refills | Status: AC

## 2020-07-27 MED ORDER — NIFEDIPINE ER 30 MG PO TB24: 30.0000 mg | ORAL_TABLET | Freq: Every day | ORAL | 1 refills | Status: AC

## 2020-07-27 MED ORDER — BENZOCAINE-MENTHOL 20-0.5 % EX AERO: 1.0000 "application " | INHALATION_SPRAY | CUTANEOUS | 1 refills | Status: AC | PRN

## 2020-07-27 MED ORDER — ACETAMINOPHEN 325 MG PO TABS: 650.0000 mg | ORAL_TABLET | ORAL | 1 refills | Status: AC | PRN

## 2020-07-27 MED ORDER — IBUPROFEN 600 MG PO TABS: 600.0000 mg | ORAL_TABLET | Freq: Four times a day (QID) | ORAL | 0 refills | Status: AC

## 2020-07-27 NOTE — Discharge Instructions (Signed)
Postpartum Hypertension Postpartum hypertension is high blood pressure that is higher than normal after childbirth. It usually starts within 1 to 2 days after delivery, but it can happen at any time for up to 6 weeks after delivery. For some women, medical treatment is required to prevent serious complications, such as seizures or stroke. What are the causes? The cause of this condition is not well understood. In some cases, the cause may not be known. Certain conditions may increase your risk. These include:  Hypertension that existed before pregnancy (chronic hypertension).  Hypertension that comes as a result of pregnancy (gestational hypertension).  Hypertensive disorders during pregnancy or seizures in women who have high blood pressure during pregnancy. These conditions are called preeclampsia and eclampsia.  A condition in which the liver, platelets, and red blood cells are damaged during pregnancy (HELLP syndrome).  Obesity.  Diabetes. What are the signs or symptoms? As with all types of hypertension, postpartum hypertension may not have any symptoms. Depending on how high your blood pressure is, you may experience:  Headaches. These may be mild, moderate, or severe. They may also be steady, constant, or sudden in onset (thunderclap headache).  Vision changes, such as blurry vision, flashing lights, or seeing spots.  Nausea and vomiting.  Pain in the upper right side of your abdomen.  Shortness of breath.  Difficulty breathing while lying down.  A decrease in the amount of urine that you pass. How is this diagnosed? This condition may be diagnosed based on the results of a physical exam, blood pressure measurements, and blood and urine tests. You may also have other tests, such as a CT scan or an MRI, to check for other problems of postpartum hypertension. How is this treated? If blood pressure is high enough to require treatment, your options may include:  Medicines to  reduce blood pressure (antihypertensives). Tell your health care provider if you are breastfeeding or if you plan to breastfeed. There are many antihypertensive medicines that are safe to take while breastfeeding.  Treating medical conditions that are causing hypertension.  Treating the complications of hypertension, such as seizures, stroke, or kidney problems. Your health care provider will also continue to monitor your blood pressure closely until it is within a safe range for you. Follow these instructions at home: Learn your goal blood pressure Two numbers make up your blood pressure. The first number is called systolic pressure. The second is called diastolic pressure. An example of a blood pressure reading is "120 over 80" (or 120/80). For most people, goal blood pressure is:  First number: below 140.  Second number: below 90. Your blood pressure is above normal even if only the top or bottom number is above normal. Know what to do before you take your blood pressure 30 minutes before you check your blood pressure:  Do not drink caffeine.  Do not drink alcohol.  Avoid food and drink.  Do not smoke.  Do not exercise. 5 minutes before you check your blood pressure:  Use the bathroom and urinate so that you have an empty bladder.  Sit quietly in a dining room chair. Do not sit in a soft couch or an armchair. Do not talk. Know how to take your blood pressure To check your blood pressure, follow the instructions in the manual that came with your blood pressure monitor. If you have a digital blood pressure monitor, the instructions may be as follows: 1. Sit up straight. 2. Place your feet on the floor. Do   not cross your ankles or legs. 3. Rest your left arm at the level of your heart. You may rest it on a table, desk, or chair. 4. Pull up your shirt sleeve. 5. Wrap the blood pressure cuff around the upper part of your left arm. The cuff should be 1 inch (2.5 cm) above your  elbow. It is best to wrap the cuff around bare skin. 6. Fit the cuff snugly around your arm. You should be able to place only one finger between the cuff and your arm. 7. Put the cord inside the groove of your elbow. 8. Press the power button. 9. Sit quietly while the cuff fills with air and loses air. 10. Write down the numbers on the screen. These are your blood pressure readings. 11. Wait 1-2 minutes and then repeat steps 1-10.   Record your blood pressure readings Follow your health care provider's instructions on how to record your blood pressure readings. If you were asked to use this form, follow these instructions:  Get one reading in the morning (a.m.) before you take any medicines.  Get one reading in the evening (p.m.) before supper.  Take at least 2 readings with each blood pressure check. This makes sure the results are correct. Wait 1-2 minutes between measurements.  Write down the results in the spaces on this form. Date: _______________________  a.m. _____________________(1st reading) _____________________(2nd reading)  p.m. _____________________(1st reading) _____________________(2nd reading) Date: _______________________  a.m. _____________________(1st reading) _____________________(2nd reading)  p.m. _____________________(1st reading) _____________________(2nd reading) Date: _______________________  a.m. _____________________(1st reading) _____________________(2nd reading)  p.m. _____________________(1st reading) _____________________(2nd reading) Date: _______________________  a.m. _____________________(1st reading) _____________________(2nd reading)  p.m. _____________________(1st reading) _____________________(2nd reading) Date: _______________________  a.m. _____________________(1st reading) _____________________(2nd reading)  p.m. _____________________(1st reading) _____________________(2nd reading) General instructions  Take over-the-counter and  prescription medicines only as told by your health care provider.  Do not use any products that contain nicotine or tobacco. These products include cigarettes, chewing tobacco, and vaping devices, such as e-cigarettes. If you need help quitting, ask your health care provider.  Check your blood pressure as often as recommended by your health care provider.  Return to your normal activities as told by your health care provider. Ask your health care provider what activities are safe for you.  Keep all follow-up visits. This is important. Contact a health care provider if:  You have new symptoms, such as: ? A headache that does not get better. ? Dizziness. ? Visual changes. ? Nausea and vomiting. Get help right away if:  You develop difficulty breathing.  You have chest pain.  You faint.  You have any symptoms of a stroke. "BE FAST" is an easy way to remember the main warning signs of a stroke: ? B - Balance. Signs are dizziness, sudden trouble walking, or loss of balance. ? E - Eyes. Signs are trouble seeing or a sudden change in vision. ? F - Face. Signs are sudden weakness or numbness of the face, or the face or eyelid drooping on one side. ? A - Arms. Signs are weakness or numbness in an arm. This happens suddenly and usually on one side of the body. ? S - Speech. Signs are sudden trouble speaking, slurred speech, or trouble understanding what people say. ? T - Time. Time to call emergency services. Write down what time symptoms started.  You have other signs of a stroke, such as: ? A sudden, severe headache with no known cause. ? Nausea or vomiting. ? Seizure. These symptoms   may represent a serious problem that is an emergency. Do not wait to see if the symptoms will go away. Get medical help right away. Call your local emergency services (911 in the [place] Do not drive yourself to the hospital. Summary  Postpartum hypertension is high blood pressure that remains higher than  normal after childbirth.  For some women, medical treatment is required to prevent serious complications, such as seizures or stroke.  Follow your health care provider's instructions on how to record your blood pressure readings.  Keep all follow-up visits. This is important. This information is not intended to replace advice given to you by your health care provider. Make sure you discuss any questions you have with your health care provider. Document Revised: 03/24/2020 Document Reviewed: 03/24/2020 Elsevier Patient Education  2021 [employer]  

## 2020-07-27 NOTE — Plan of Care (Signed)
Self care teaching completed. Blood pressures this shift much improved.

## 2020-07-27 NOTE — Discharge Summary (Signed)
OB Discharge Summary  Patient Name: Anna Harrell DOB: 1976-04-22 MRN: [id number]  Date of admission: 07/22/2020 Delivering provider: Dorisann Frames K   Admitting diagnosis: Normal labor and delivery [O80] Intrauterine pregnancy: [redacted]w[redacted]d     Secondary diagnosis: Patient Active Problem List   Diagnosis Date Noted  . SVD (spontaneous vaginal delivery) 1/13 07/23/2020  . Postpartum care following vaginal delivery 1/13 07/23/2020  . Anxiety and depression 07/23/2020  . Normal labor and delivery 07/22/2020  . Preeclampsia, third trimester 01/21/2018   Date of discharge: 07/27/2020   Discharge diagnosis: Principal Problem:   Postpartum care following vaginal delivery 1/13 Active Problems:   Preeclampsia, third trimester   Normal labor and delivery   SVD (spontaneous vaginal delivery) 1/13   Anxiety and depression                                                         Post partum procedures:Mag sulfate  Augmentation: AROM, Pitocin and Cytotec Pain control: Epidural  Laceration:2nd degree  Episiotomy:None  Complications: None  Hospital course:  Induction of Labor With Vaginal Delivery   45 y.o. yo [id number] at [redacted]w[redacted]d was admitted to the hospital 07/22/2020 for induction of labor.  Indication for induction: Preeclampsia.  Patient had an uncomplicated labor course as follows: Membrane Rupture Time/Date: 10:07 AM ,07/23/2020   Delivery Method:Vaginal, Spontaneous  Episiotomy: None  Lacerations:  2nd degree  Details of delivery can be found in separate delivery note. Patient had a postpartum course complicated by preeclampsia. She received magnesium sulfate for 24 hours post delivery and was started on PO Procardia and labetalol. Her BP remained labile throughout hospital stay. She will follow-up at the office in 1 week for a BP check. She also has anxiety and was restarted on PO Vistaril. She will have close follow-up for mood. Patient is discharged home 07/27/20.  Newborn Data: Birth  date:07/[birth year]/2020  Birth time:11:39 AM  Gender:Female  Living status:Living  Apgars:7 ,9  Weight:3420 g   Physical exam  Vitals:   07/27/20 0416 07/27/20 0530 07/27/20 0839 07/27/20 0840  BP: 115/78  (!) 152/93   Pulse: 87  75   Resp: 15  16   Temp:   98.2 F (36.8 C)   TempSrc: Oral  Oral   SpO2: 99%  100% 99%  Weight:  71.6 kg    Height:       General: alert, cooperative and no distress Lochia: appropriate Uterine Fundus: firm Perineum: repair intact, no edema DVT Evaluation: No evidence of DVT seen on physical exam. No significant calf/ankle edema. Labs: Lab Results  Component Value Date   WBC 18.0 (H) 07/24/2020   HGB 11.1 (L) 07/24/2020   HCT 33.4 (L) 07/24/2020   MCV 91.8 07/24/2020   PLT 205 07/24/2020   CMP Latest Ref Rng & Units 07/24/2020  Glucose 70 - 99 mg/dL 269(S)  BUN 6 - 20 mg/dL 6  Creatinine 8.54 - 6.27 mg/dL 0.35  Sodium 009 - 381 mmol/L 134(L)  Potassium 3.5 - 5.1 mmol/L 3.7  Chloride 98 - 111 mmol/L 104  CO2 22 - 32 mmol/L 21(L)  Calcium 8.9 - 10.3 mg/dL 8.2(X)  Total Protein 6.5 - 8.1 g/dL 5.3(L)  Total Bilirubin 0.3 - 1.2 mg/dL 9.3(Z)  Alkaline Phos 38 - 126 U/L 78  AST 15 - 41 U/L 38  ALT 0 - 44 U/L 33   Edinburgh Postnatal Depression Scale Screening Tool 07/24/2020 01/24/2018  I have been able to laugh and see the funny side of things. 0 0  I have looked forward with enjoyment to things. 0 1  I have blamed myself unnecessarily when things went wrong. 2 2  I have been anxious or worried for no good reason. 2 1  I have felt scared or panicky for no good reason. 2 2  Things have been getting on top of me. 1 1  I have been so unhappy that I have had difficulty sleeping. 0 1  I have felt sad or miserable. 1 1  I have been so unhappy that I have been crying. 1 1  The thought of harming myself has occurred to me. 0 0  Edinburgh Postnatal Depression Scale Total 9 10   Vaccines: TDaP UTD         Flu    ???         COVID-19   UTD  Discharge  instructions:  per After Visit Summary and [facility] OB booklet  After Visit Meds:  Allergies as of 07/27/2020   No Known Allergies     Medication List    STOP taking these medications   aspirin EC 81 MG tablet     TAKE these medications   acetaminophen 325 MG tablet Commonly known as: Tylenol Take 2 tablets (650 mg total) by mouth every 4 (four) hours as needed (for pain scale < 4).   benzocaine-Menthol 20-0.5 % Aero Commonly known as: DERMOPLAST Apply 1 application topically as needed for irritation (perineal discomfort).   coconut oil Oil Apply 1 application topically as needed.   ibuprofen 600 MG tablet Commonly known as: ADVIL Take 1 tablet (600 mg total) by mouth every 6 (six) hours.   labetalol 200 MG tablet Commonly known as: NORMODYNE Take 1 tablet (200 mg total) by mouth 3 (three) times daily.   multivitamin-prenatal 27-0.8 MG Tabs tablet Take 1 tablet by mouth daily at 12 noon.   NIFEdipine 30 MG 24 hr tablet Commonly known as: ADALAT CC Take 1 tablet (30 mg total) by mouth daily. Start taking on: July 28, 2020            Discharge Care Instructions  (From admission, onward)         Start     Ordered   07/27/20 0000  Discharge wound care:       Comments: Sitz baths 2 times /day with warm water x 1 week. May add herbals: 1 ounce dried comfrey leaf* 1 ounce calendula flowers 1 ounce lavender flowers  Supplies can be found online at [employer] sources at [employer], [employer]  1/2 ounce dried uva ursi leaves 1/2 ounce witch hazel blossoms (if you can find them) 1/2 ounce dried sage leaf 1/2 cup sea salt Directions: Bring 2 quarts of water to a boil. Turn off heat, and place 1 ounce (approximately 1 large handful) of the above mixed herbs (not the salt) into the pot. Steep, covered, for 30 minutes.  Strain the liquid well with a fine mesh strainer, and discard the herb material. Add 2 quarts of liquid to the tub, along with  the 1/2 cup of salt. This medicinal liquid can also be made into compresses and peri-rinses.   07/27/20 0931         Diet: routine diet  Activity: Advance as tolerated. Pelvic rest for 6 weeks.  Newborn Data: Live born female  Birth Weight: 7 lb 8.6 oz (3420 g) APGAR: 7, 9  Newborn Delivery   Birth date/time: 07/23/2020 11:39:00 Delivery type: Vaginal, Spontaneous     Named Clovis Riley Baby Feeding: Breast Disposition:home with mother  Delivery Report:  Review the Delivery Report for details.    Follow up:  Follow-up Information    Amado Nash Candace Gallus, MD. Schedule an appointment as soon as possible for a visit in 1 week(s).   Specialty: Obstetrics and Gynecology Why: Please make an appointment for 1 week for a blood pressure check. Contact information: [address] [place] [address] [phone]               Clancy Gourd, MSN 07/27/2020, 9:32 AM

## 2020-07-27 NOTE — Progress Notes (Signed)
Patient's blood pressure analyzed prior to discharge and was elevated (161/90). Upon attempt to recheck the patient took the cuff off of her arm and refused to be rechecked. CNM Dorisann Frames notified at [phone]. Per Dorisann Frames, CNM, discharge patient with follow up appointment.

## 2020-07-27 NOTE — Lactation Note (Signed)
This note was copied from a baby's chart. Lactation Consultation Note  Patient Name: Anna Harrell [id number] Date: 07/27/2020 Reason for consult: Follow-up assessment Age:44 days  LC to room for f/u visit and d/c ed. Infant's output and wt loss are wnl. Mom's milk is in and she denies engorgement. Baby remains a little sleepy today related to hx of elevated bilirubin, but mom is able to wake and feed him. She pumps p bf prn. Patient was provided with the opportunity to ask questions. All concerns were addressed.  Mom is aware of LC services p d/c.   Consult Status Consult Status: Complete Follow-up type: In-patient    Elder Negus, MA IBCLC 07/27/2020, 10:14 AM

## 2020-08-03 DIAGNOSIS — F419 Anxiety disorder, unspecified: Secondary | ICD-10-CM | POA: Diagnosis not present

## 2020-08-03 DIAGNOSIS — O1415 Severe pre-eclampsia, complicating the puerperium: Secondary | ICD-10-CM | POA: Diagnosis not present

## 2020-08-08 ENCOUNTER — Inpatient Hospital Stay (HOSPITAL_COMMUNITY): Admit: 2020-08-08 | Payer: Self-pay

## 2020-08-12 DIAGNOSIS — F419 Anxiety disorder, unspecified: Secondary | ICD-10-CM | POA: Diagnosis not present

## 2020-08-12 DIAGNOSIS — O1415 Severe pre-eclampsia, complicating the puerperium: Secondary | ICD-10-CM | POA: Diagnosis not present

## 2020-08-14 DIAGNOSIS — F419 Anxiety disorder, unspecified: Secondary | ICD-10-CM | POA: Diagnosis not present

## 2020-08-14 DIAGNOSIS — I10 Essential (primary) hypertension: Secondary | ICD-10-CM | POA: Diagnosis not present

## 2020-08-19 DIAGNOSIS — F419 Anxiety disorder, unspecified: Secondary | ICD-10-CM | POA: Diagnosis not present

## 2020-08-19 DIAGNOSIS — O1415 Severe pre-eclampsia, complicating the puerperium: Secondary | ICD-10-CM | POA: Diagnosis not present

## 2020-08-20 DIAGNOSIS — F4325 Adjustment disorder with mixed disturbance of emotions and conduct: Secondary | ICD-10-CM | POA: Diagnosis not present

## 2020-08-25 DIAGNOSIS — F4325 Adjustment disorder with mixed disturbance of emotions and conduct: Secondary | ICD-10-CM | POA: Diagnosis not present

## 2020-10-09 DIAGNOSIS — F419 Anxiety disorder, unspecified: Secondary | ICD-10-CM | POA: Diagnosis not present

## 2020-10-09 DIAGNOSIS — I10 Essential (primary) hypertension: Secondary | ICD-10-CM | POA: Diagnosis not present

## 2020-12-22 DIAGNOSIS — Z01419 Encounter for gynecological examination (general) (routine) without abnormal findings: Secondary | ICD-10-CM | POA: Diagnosis not present

## 2020-12-22 DIAGNOSIS — Z6823 Body mass index (BMI) 23.0-23.9, adult: Secondary | ICD-10-CM | POA: Diagnosis not present

## 2020-12-22 DIAGNOSIS — Z1231 Encounter for screening mammogram for malignant neoplasm of breast: Secondary | ICD-10-CM | POA: Diagnosis not present

## 2020-12-22 DIAGNOSIS — Z124 Encounter for screening for malignant neoplasm of cervix: Secondary | ICD-10-CM | POA: Diagnosis not present

## 2020-12-31 DIAGNOSIS — M9901 Segmental and somatic dysfunction of cervical region: Secondary | ICD-10-CM | POA: Diagnosis not present

## 2020-12-31 DIAGNOSIS — M9903 Segmental and somatic dysfunction of lumbar region: Secondary | ICD-10-CM | POA: Diagnosis not present

## 2020-12-31 DIAGNOSIS — M9905 Segmental and somatic dysfunction of pelvic region: Secondary | ICD-10-CM | POA: Diagnosis not present

## 2020-12-31 DIAGNOSIS — M9904 Segmental and somatic dysfunction of sacral region: Secondary | ICD-10-CM | POA: Diagnosis not present

## 2021-01-04 DIAGNOSIS — M9905 Segmental and somatic dysfunction of pelvic region: Secondary | ICD-10-CM | POA: Diagnosis not present

## 2021-01-04 DIAGNOSIS — M9901 Segmental and somatic dysfunction of cervical region: Secondary | ICD-10-CM | POA: Diagnosis not present

## 2021-01-04 DIAGNOSIS — M9903 Segmental and somatic dysfunction of lumbar region: Secondary | ICD-10-CM | POA: Diagnosis not present

## 2021-01-04 DIAGNOSIS — M9904 Segmental and somatic dysfunction of sacral region: Secondary | ICD-10-CM | POA: Diagnosis not present

## 2021-01-06 DIAGNOSIS — M9905 Segmental and somatic dysfunction of pelvic region: Secondary | ICD-10-CM | POA: Diagnosis not present

## 2021-01-06 DIAGNOSIS — M9901 Segmental and somatic dysfunction of cervical region: Secondary | ICD-10-CM | POA: Diagnosis not present

## 2021-01-06 DIAGNOSIS — M9903 Segmental and somatic dysfunction of lumbar region: Secondary | ICD-10-CM | POA: Diagnosis not present

## 2021-01-06 DIAGNOSIS — M9904 Segmental and somatic dysfunction of sacral region: Secondary | ICD-10-CM | POA: Diagnosis not present

## 2021-01-13 DIAGNOSIS — M9903 Segmental and somatic dysfunction of lumbar region: Secondary | ICD-10-CM | POA: Diagnosis not present

## 2021-01-13 DIAGNOSIS — M9904 Segmental and somatic dysfunction of sacral region: Secondary | ICD-10-CM | POA: Diagnosis not present

## 2021-01-13 DIAGNOSIS — M9905 Segmental and somatic dysfunction of pelvic region: Secondary | ICD-10-CM | POA: Diagnosis not present

## 2021-01-13 DIAGNOSIS — M9901 Segmental and somatic dysfunction of cervical region: Secondary | ICD-10-CM | POA: Diagnosis not present

## 2021-01-15 DIAGNOSIS — M9905 Segmental and somatic dysfunction of pelvic region: Secondary | ICD-10-CM | POA: Diagnosis not present

## 2021-01-15 DIAGNOSIS — M9904 Segmental and somatic dysfunction of sacral region: Secondary | ICD-10-CM | POA: Diagnosis not present

## 2021-01-15 DIAGNOSIS — M9901 Segmental and somatic dysfunction of cervical region: Secondary | ICD-10-CM | POA: Diagnosis not present

## 2021-01-15 DIAGNOSIS — M9903 Segmental and somatic dysfunction of lumbar region: Secondary | ICD-10-CM | POA: Diagnosis not present

## 2021-01-19 DIAGNOSIS — M9901 Segmental and somatic dysfunction of cervical region: Secondary | ICD-10-CM | POA: Diagnosis not present

## 2021-01-19 DIAGNOSIS — M9905 Segmental and somatic dysfunction of pelvic region: Secondary | ICD-10-CM | POA: Diagnosis not present

## 2021-01-19 DIAGNOSIS — M9904 Segmental and somatic dysfunction of sacral region: Secondary | ICD-10-CM | POA: Diagnosis not present

## 2021-01-19 DIAGNOSIS — M9903 Segmental and somatic dysfunction of lumbar region: Secondary | ICD-10-CM | POA: Diagnosis not present

## 2021-01-20 DIAGNOSIS — M9903 Segmental and somatic dysfunction of lumbar region: Secondary | ICD-10-CM | POA: Diagnosis not present

## 2021-01-20 DIAGNOSIS — M9905 Segmental and somatic dysfunction of pelvic region: Secondary | ICD-10-CM | POA: Diagnosis not present

## 2021-01-20 DIAGNOSIS — M9904 Segmental and somatic dysfunction of sacral region: Secondary | ICD-10-CM | POA: Diagnosis not present

## 2021-01-20 DIAGNOSIS — M9901 Segmental and somatic dysfunction of cervical region: Secondary | ICD-10-CM | POA: Diagnosis not present

## 2021-01-25 DIAGNOSIS — M9903 Segmental and somatic dysfunction of lumbar region: Secondary | ICD-10-CM | POA: Diagnosis not present

## 2021-01-25 DIAGNOSIS — M9904 Segmental and somatic dysfunction of sacral region: Secondary | ICD-10-CM | POA: Diagnosis not present

## 2021-01-25 DIAGNOSIS — M9905 Segmental and somatic dysfunction of pelvic region: Secondary | ICD-10-CM | POA: Diagnosis not present

## 2021-01-25 DIAGNOSIS — M9901 Segmental and somatic dysfunction of cervical region: Secondary | ICD-10-CM | POA: Diagnosis not present

## 2021-01-27 DIAGNOSIS — M9905 Segmental and somatic dysfunction of pelvic region: Secondary | ICD-10-CM | POA: Diagnosis not present

## 2021-01-27 DIAGNOSIS — M9904 Segmental and somatic dysfunction of sacral region: Secondary | ICD-10-CM | POA: Diagnosis not present

## 2021-01-27 DIAGNOSIS — M9901 Segmental and somatic dysfunction of cervical region: Secondary | ICD-10-CM | POA: Diagnosis not present

## 2021-01-27 DIAGNOSIS — M9903 Segmental and somatic dysfunction of lumbar region: Secondary | ICD-10-CM | POA: Diagnosis not present

## 2021-02-04 DIAGNOSIS — M9905 Segmental and somatic dysfunction of pelvic region: Secondary | ICD-10-CM | POA: Diagnosis not present

## 2021-02-04 DIAGNOSIS — M9901 Segmental and somatic dysfunction of cervical region: Secondary | ICD-10-CM | POA: Diagnosis not present

## 2021-02-04 DIAGNOSIS — M9903 Segmental and somatic dysfunction of lumbar region: Secondary | ICD-10-CM | POA: Diagnosis not present

## 2021-02-04 DIAGNOSIS — M9904 Segmental and somatic dysfunction of sacral region: Secondary | ICD-10-CM | POA: Diagnosis not present

## 2021-02-05 DIAGNOSIS — M9905 Segmental and somatic dysfunction of pelvic region: Secondary | ICD-10-CM | POA: Diagnosis not present

## 2021-02-05 DIAGNOSIS — M9904 Segmental and somatic dysfunction of sacral region: Secondary | ICD-10-CM | POA: Diagnosis not present

## 2021-02-05 DIAGNOSIS — M9901 Segmental and somatic dysfunction of cervical region: Secondary | ICD-10-CM | POA: Diagnosis not present

## 2021-02-05 DIAGNOSIS — M9903 Segmental and somatic dysfunction of lumbar region: Secondary | ICD-10-CM | POA: Diagnosis not present

## 2021-02-09 DIAGNOSIS — M9903 Segmental and somatic dysfunction of lumbar region: Secondary | ICD-10-CM | POA: Diagnosis not present

## 2021-02-09 DIAGNOSIS — M9904 Segmental and somatic dysfunction of sacral region: Secondary | ICD-10-CM | POA: Diagnosis not present

## 2021-02-09 DIAGNOSIS — M9901 Segmental and somatic dysfunction of cervical region: Secondary | ICD-10-CM | POA: Diagnosis not present

## 2021-02-09 DIAGNOSIS — M9905 Segmental and somatic dysfunction of pelvic region: Secondary | ICD-10-CM | POA: Diagnosis not present

## 2021-02-11 DIAGNOSIS — M9903 Segmental and somatic dysfunction of lumbar region: Secondary | ICD-10-CM | POA: Diagnosis not present

## 2021-02-11 DIAGNOSIS — M9904 Segmental and somatic dysfunction of sacral region: Secondary | ICD-10-CM | POA: Diagnosis not present

## 2021-02-11 DIAGNOSIS — M9901 Segmental and somatic dysfunction of cervical region: Secondary | ICD-10-CM | POA: Diagnosis not present

## 2021-02-11 DIAGNOSIS — M9905 Segmental and somatic dysfunction of pelvic region: Secondary | ICD-10-CM | POA: Diagnosis not present

## 2021-02-16 DIAGNOSIS — M9904 Segmental and somatic dysfunction of sacral region: Secondary | ICD-10-CM | POA: Diagnosis not present

## 2021-02-16 DIAGNOSIS — M9901 Segmental and somatic dysfunction of cervical region: Secondary | ICD-10-CM | POA: Diagnosis not present

## 2021-02-16 DIAGNOSIS — M9905 Segmental and somatic dysfunction of pelvic region: Secondary | ICD-10-CM | POA: Diagnosis not present

## 2021-02-16 DIAGNOSIS — M9903 Segmental and somatic dysfunction of lumbar region: Secondary | ICD-10-CM | POA: Diagnosis not present

## 2021-02-18 DIAGNOSIS — M9905 Segmental and somatic dysfunction of pelvic region: Secondary | ICD-10-CM | POA: Diagnosis not present

## 2021-02-18 DIAGNOSIS — M9903 Segmental and somatic dysfunction of lumbar region: Secondary | ICD-10-CM | POA: Diagnosis not present

## 2021-02-18 DIAGNOSIS — M9904 Segmental and somatic dysfunction of sacral region: Secondary | ICD-10-CM | POA: Diagnosis not present

## 2021-02-18 DIAGNOSIS — M9901 Segmental and somatic dysfunction of cervical region: Secondary | ICD-10-CM | POA: Diagnosis not present

## 2021-02-19 DIAGNOSIS — I10 Essential (primary) hypertension: Secondary | ICD-10-CM | POA: Diagnosis not present

## 2021-02-23 DIAGNOSIS — M9905 Segmental and somatic dysfunction of pelvic region: Secondary | ICD-10-CM | POA: Diagnosis not present

## 2021-02-23 DIAGNOSIS — M9904 Segmental and somatic dysfunction of sacral region: Secondary | ICD-10-CM | POA: Diagnosis not present

## 2021-02-23 DIAGNOSIS — M9901 Segmental and somatic dysfunction of cervical region: Secondary | ICD-10-CM | POA: Diagnosis not present

## 2021-02-23 DIAGNOSIS — M9903 Segmental and somatic dysfunction of lumbar region: Secondary | ICD-10-CM | POA: Diagnosis not present

## 2021-02-26 DIAGNOSIS — M9903 Segmental and somatic dysfunction of lumbar region: Secondary | ICD-10-CM | POA: Diagnosis not present

## 2021-02-26 DIAGNOSIS — I10 Essential (primary) hypertension: Secondary | ICD-10-CM | POA: Diagnosis not present

## 2021-02-26 DIAGNOSIS — M9901 Segmental and somatic dysfunction of cervical region: Secondary | ICD-10-CM | POA: Diagnosis not present

## 2021-02-26 DIAGNOSIS — M9905 Segmental and somatic dysfunction of pelvic region: Secondary | ICD-10-CM | POA: Diagnosis not present

## 2021-02-26 DIAGNOSIS — F419 Anxiety disorder, unspecified: Secondary | ICD-10-CM | POA: Diagnosis not present

## 2021-02-26 DIAGNOSIS — M9904 Segmental and somatic dysfunction of sacral region: Secondary | ICD-10-CM | POA: Diagnosis not present

## 2021-03-02 DIAGNOSIS — M9903 Segmental and somatic dysfunction of lumbar region: Secondary | ICD-10-CM | POA: Diagnosis not present

## 2021-03-02 DIAGNOSIS — M9905 Segmental and somatic dysfunction of pelvic region: Secondary | ICD-10-CM | POA: Diagnosis not present

## 2021-03-02 DIAGNOSIS — M9901 Segmental and somatic dysfunction of cervical region: Secondary | ICD-10-CM | POA: Diagnosis not present

## 2021-03-02 DIAGNOSIS — M9904 Segmental and somatic dysfunction of sacral region: Secondary | ICD-10-CM | POA: Diagnosis not present

## 2021-03-30 DIAGNOSIS — F419 Anxiety disorder, unspecified: Secondary | ICD-10-CM | POA: Diagnosis not present

## 2021-03-30 DIAGNOSIS — Z23 Encounter for immunization: Secondary | ICD-10-CM | POA: Diagnosis not present

## 2021-03-30 DIAGNOSIS — I10 Essential (primary) hypertension: Secondary | ICD-10-CM | POA: Diagnosis not present

## 2021-08-03 DIAGNOSIS — G43909 Migraine, unspecified, not intractable, without status migrainosus: Secondary | ICD-10-CM | POA: Diagnosis not present

## 2021-08-25 DIAGNOSIS — Z1231 Encounter for screening mammogram for malignant neoplasm of breast: Secondary | ICD-10-CM | POA: Diagnosis not present

## 2022-03-07 DIAGNOSIS — I10 Essential (primary) hypertension: Secondary | ICD-10-CM | POA: Diagnosis not present

## 2022-03-07 DIAGNOSIS — Z131 Encounter for screening for diabetes mellitus: Secondary | ICD-10-CM | POA: Diagnosis not present

## 2022-03-07 DIAGNOSIS — Z0000 Encounter for general adult medical examination without abnormal findings: Secondary | ICD-10-CM | POA: Diagnosis not present

## 2022-03-07 DIAGNOSIS — G43909 Migraine, unspecified, not intractable, without status migrainosus: Secondary | ICD-10-CM | POA: Diagnosis not present

## 2022-05-13 DIAGNOSIS — M9903 Segmental and somatic dysfunction of lumbar region: Secondary | ICD-10-CM | POA: Diagnosis not present

## 2022-05-13 DIAGNOSIS — M9902 Segmental and somatic dysfunction of thoracic region: Secondary | ICD-10-CM | POA: Diagnosis not present

## 2022-05-13 DIAGNOSIS — M9901 Segmental and somatic dysfunction of cervical region: Secondary | ICD-10-CM | POA: Diagnosis not present

## 2022-05-13 DIAGNOSIS — M9905 Segmental and somatic dysfunction of pelvic region: Secondary | ICD-10-CM | POA: Diagnosis not present

## 2022-06-14 DIAGNOSIS — H6593 Unspecified nonsuppurative otitis media, bilateral: Secondary | ICD-10-CM | POA: Diagnosis not present

## 2022-06-14 DIAGNOSIS — J40 Bronchitis, not specified as acute or chronic: Secondary | ICD-10-CM | POA: Diagnosis not present

## 2022-06-14 DIAGNOSIS — R052 Subacute cough: Secondary | ICD-10-CM | POA: Diagnosis not present

## 2022-12-06 DIAGNOSIS — Z1231 Encounter for screening mammogram for malignant neoplasm of breast: Secondary | ICD-10-CM | POA: Diagnosis not present

## 2023-01-25 DIAGNOSIS — I10 Essential (primary) hypertension: Secondary | ICD-10-CM | POA: Diagnosis not present

## 2023-01-25 DIAGNOSIS — Z01411 Encounter for gynecological examination (general) (routine) with abnormal findings: Secondary | ICD-10-CM | POA: Diagnosis not present

## 2023-01-25 DIAGNOSIS — F419 Anxiety disorder, unspecified: Secondary | ICD-10-CM | POA: Diagnosis not present

## 2023-03-05 DIAGNOSIS — Z1211 Encounter for screening for malignant neoplasm of colon: Secondary | ICD-10-CM | POA: Diagnosis not present

## 2023-03-05 DIAGNOSIS — Z1212 Encounter for screening for malignant neoplasm of rectum: Secondary | ICD-10-CM | POA: Diagnosis not present

## 2023-03-09 LAB — COLOGUARD: COLOGUARD: NEGATIVE

## 2023-03-09 LAB — EXTERNAL GENERIC LAB PROCEDURE: COLOGUARD: NEGATIVE

## 2023-03-15 DIAGNOSIS — I10 Essential (primary) hypertension: Secondary | ICD-10-CM | POA: Diagnosis not present

## 2023-03-15 DIAGNOSIS — G43909 Migraine, unspecified, not intractable, without status migrainosus: Secondary | ICD-10-CM | POA: Diagnosis not present

## 2023-03-15 DIAGNOSIS — Z0000 Encounter for general adult medical examination without abnormal findings: Secondary | ICD-10-CM | POA: Diagnosis not present

## 2023-03-15 DIAGNOSIS — Z131 Encounter for screening for diabetes mellitus: Secondary | ICD-10-CM | POA: Diagnosis not present

## 2023-03-15 DIAGNOSIS — F419 Anxiety disorder, unspecified: Secondary | ICD-10-CM | POA: Diagnosis not present

## 2023-03-15 DIAGNOSIS — Z13220 Encounter for screening for lipoid disorders: Secondary | ICD-10-CM | POA: Diagnosis not present

## 2023-03-15 DIAGNOSIS — Z23 Encounter for immunization: Secondary | ICD-10-CM | POA: Diagnosis not present

## 2023-03-21 ENCOUNTER — Other Ambulatory Visit: Payer: Self-pay | Admitting: Family Medicine

## 2023-03-21 DIAGNOSIS — R7401 Elevation of levels of liver transaminase levels: Secondary | ICD-10-CM

## 2023-03-27 ENCOUNTER — Ambulatory Visit
Admission: RE | Admit: 2023-03-27 | Discharge: 2023-03-27 | Disposition: A | Discharge status: Home / Self Care | Payer: BC Managed Care – PPO | Source: Ambulatory Visit | Attending: Family Medicine | Admitting: Family Medicine

## 2023-03-27 DIAGNOSIS — R7989 Other specified abnormal findings of blood chemistry: Secondary | ICD-10-CM | POA: Diagnosis not present

## 2023-03-27 DIAGNOSIS — R7401 Elevation of levels of liver transaminase levels: Secondary | ICD-10-CM

## 2023-03-29 DIAGNOSIS — I10 Essential (primary) hypertension: Secondary | ICD-10-CM | POA: Diagnosis not present

## 2023-03-29 DIAGNOSIS — R7401 Elevation of levels of liver transaminase levels: Secondary | ICD-10-CM | POA: Diagnosis not present

## 2023-09-13 DIAGNOSIS — G43909 Migraine, unspecified, not intractable, without status migrainosus: Secondary | ICD-10-CM | POA: Diagnosis not present

## 2023-09-13 DIAGNOSIS — F419 Anxiety disorder, unspecified: Secondary | ICD-10-CM | POA: Diagnosis not present

## 2023-09-13 DIAGNOSIS — I10 Essential (primary) hypertension: Secondary | ICD-10-CM | POA: Diagnosis not present

## 2024-02-09 DIAGNOSIS — Z1231 Encounter for screening mammogram for malignant neoplasm of breast: Secondary | ICD-10-CM | POA: Diagnosis not present

## 2024-02-12 ENCOUNTER — Encounter: Payer: Self-pay | Admitting: Obstetrics & Gynecology

## 2024-02-13 ENCOUNTER — Other Ambulatory Visit: Payer: Self-pay | Admitting: Obstetrics & Gynecology

## 2024-02-13 DIAGNOSIS — R928 Other abnormal and inconclusive findings on diagnostic imaging of breast: Secondary | ICD-10-CM

## 2024-02-19 ENCOUNTER — Ambulatory Visit
Admission: RE | Admit: 2024-02-19 | Discharge: 2024-02-19 | Disposition: A | Discharge status: Home / Self Care | Source: Ambulatory Visit | Attending: Obstetrics & Gynecology | Admitting: Obstetrics & Gynecology

## 2024-02-19 ENCOUNTER — Ambulatory Visit: Admission: RE | Admit: 2024-02-19 | Source: Ambulatory Visit

## 2024-02-19 DIAGNOSIS — R928 Other abnormal and inconclusive findings on diagnostic imaging of breast: Secondary | ICD-10-CM

## 2024-04-15 DIAGNOSIS — Z13220 Encounter for screening for lipoid disorders: Secondary | ICD-10-CM | POA: Diagnosis not present

## 2024-04-15 DIAGNOSIS — G43909 Migraine, unspecified, not intractable, without status migrainosus: Secondary | ICD-10-CM | POA: Diagnosis not present

## 2024-04-15 DIAGNOSIS — Z131 Encounter for screening for diabetes mellitus: Secondary | ICD-10-CM | POA: Diagnosis not present

## 2024-04-15 DIAGNOSIS — I10 Essential (primary) hypertension: Secondary | ICD-10-CM | POA: Diagnosis not present

## 2024-04-15 DIAGNOSIS — F419 Anxiety disorder, unspecified: Secondary | ICD-10-CM | POA: Diagnosis not present

## 2024-04-15 DIAGNOSIS — Z23 Encounter for immunization: Secondary | ICD-10-CM | POA: Diagnosis not present

## 2024-04-15 DIAGNOSIS — Z0000 Encounter for general adult medical examination without abnormal findings: Secondary | ICD-10-CM | POA: Diagnosis not present
